# Patient Record
Sex: Male | Born: 1940 | Race: White | Hispanic: No | Marital: Married | Smoking: Former smoker
Health system: Southern US, Community
[De-identification: ages and names within clinical notes are randomized; demographics above are authoritative.]

## PROBLEM LIST (undated history)

## (undated) DIAGNOSIS — J449 Chronic obstructive pulmonary disease, unspecified: Secondary | ICD-10-CM

## (undated) DIAGNOSIS — C801 Malignant (primary) neoplasm, unspecified: Secondary | ICD-10-CM

## (undated) DIAGNOSIS — E7800 Pure hypercholesterolemia, unspecified: Secondary | ICD-10-CM

## (undated) DIAGNOSIS — I219 Acute myocardial infarction, unspecified: Secondary | ICD-10-CM

## (undated) DIAGNOSIS — M1990 Unspecified osteoarthritis, unspecified site: Secondary | ICD-10-CM

## (undated) DIAGNOSIS — I639 Cerebral infarction, unspecified: Secondary | ICD-10-CM

## (undated) DIAGNOSIS — I10 Essential (primary) hypertension: Secondary | ICD-10-CM

## (undated) DIAGNOSIS — I509 Heart failure, unspecified: Secondary | ICD-10-CM

## (undated) HISTORY — PX: BACK SURGERY: SHX140

## (undated) HISTORY — PX: CARDIAC SURGERY: SHX584

## (undated) HISTORY — PX: VASCULAR SURGERY: SHX849

## (undated) HISTORY — PX: CORONARY ARTERY BYPASS GRAFT: SHX141

---

## 2004-12-22 DIAGNOSIS — J439 Emphysema, unspecified: Secondary | ICD-10-CM | POA: Insufficient documentation

## 2013-03-05 DIAGNOSIS — R918 Other nonspecific abnormal finding of lung field: Secondary | ICD-10-CM | POA: Insufficient documentation

## 2013-03-05 DIAGNOSIS — L570 Actinic keratosis: Secondary | ICD-10-CM | POA: Insufficient documentation

## 2013-10-27 DIAGNOSIS — Z85828 Personal history of other malignant neoplasm of skin: Secondary | ICD-10-CM | POA: Insufficient documentation

## 2013-11-26 DIAGNOSIS — I10 Essential (primary) hypertension: Secondary | ICD-10-CM | POA: Insufficient documentation

## 2013-11-26 DIAGNOSIS — D649 Anemia, unspecified: Secondary | ICD-10-CM | POA: Insufficient documentation

## 2014-06-04 DIAGNOSIS — Z9889 Other specified postprocedural states: Secondary | ICD-10-CM | POA: Insufficient documentation

## 2014-07-22 DIAGNOSIS — I7090 Unspecified atherosclerosis: Secondary | ICD-10-CM | POA: Insufficient documentation

## 2014-08-27 DIAGNOSIS — K559 Vascular disorder of intestine, unspecified: Secondary | ICD-10-CM | POA: Insufficient documentation

## 2015-10-19 DIAGNOSIS — K5900 Constipation, unspecified: Secondary | ICD-10-CM | POA: Insufficient documentation

## 2015-10-23 DIAGNOSIS — I723 Aneurysm of iliac artery: Secondary | ICD-10-CM | POA: Insufficient documentation

## 2015-10-29 DIAGNOSIS — R413 Other amnesia: Secondary | ICD-10-CM | POA: Insufficient documentation

## 2016-05-24 DIAGNOSIS — N401 Enlarged prostate with lower urinary tract symptoms: Secondary | ICD-10-CM | POA: Insufficient documentation

## 2017-03-21 DIAGNOSIS — I739 Peripheral vascular disease, unspecified: Secondary | ICD-10-CM | POA: Insufficient documentation

## 2017-06-11 DIAGNOSIS — K4090 Unilateral inguinal hernia, without obstruction or gangrene, not specified as recurrent: Secondary | ICD-10-CM | POA: Insufficient documentation

## 2018-02-17 ENCOUNTER — Emergency Department
Admission: EM | Admit: 2018-02-17 | Discharge: 2018-02-17 | Disposition: A | Discharge status: Home / Self Care | Payer: Medicare Other | Attending: Emergency Medicine | Admitting: Emergency Medicine

## 2018-02-17 ENCOUNTER — Other Ambulatory Visit: Payer: Self-pay

## 2018-02-17 ENCOUNTER — Encounter: Payer: Self-pay | Admitting: Emergency Medicine

## 2018-02-17 DIAGNOSIS — I10 Essential (primary) hypertension: Secondary | ICD-10-CM | POA: Insufficient documentation

## 2018-02-17 DIAGNOSIS — Z951 Presence of aortocoronary bypass graft: Secondary | ICD-10-CM | POA: Diagnosis not present

## 2018-02-17 DIAGNOSIS — R040 Epistaxis: Secondary | ICD-10-CM | POA: Insufficient documentation

## 2018-02-17 DIAGNOSIS — I252 Old myocardial infarction: Secondary | ICD-10-CM | POA: Insufficient documentation

## 2018-02-17 DIAGNOSIS — Z87891 Personal history of nicotine dependence: Secondary | ICD-10-CM | POA: Diagnosis not present

## 2018-02-17 HISTORY — DX: Essential (primary) hypertension: I10

## 2018-02-17 HISTORY — DX: Acute myocardial infarction, unspecified: I21.9

## 2018-02-17 HISTORY — DX: Pure hypercholesterolemia, unspecified: E78.00

## 2018-02-17 LAB — CBC WITH DIFFERENTIAL/PLATELET
BASOS ABS: 0 10*3/uL (ref 0–0.1)
BASOS PCT: 0 %
Band Neutrophils: 2 %
Blasts: 0 %
EOS PCT: 1 %
Eosinophils Absolute: 0.1 10*3/uL (ref 0–0.7)
HCT: 38.4 % — ABNORMAL LOW (ref 40.0–52.0)
Hemoglobin: 12.7 g/dL — ABNORMAL LOW (ref 13.0–18.0)
LYMPHS ABS: 1.4 10*3/uL (ref 1.0–3.6)
Lymphocytes Relative: 14 %
MCH: 31.8 pg (ref 26.0–34.0)
MCHC: 33.1 g/dL (ref 32.0–36.0)
MCV: 95.8 fL (ref 80.0–100.0)
METAMYELOCYTES PCT: 1 %
MONO ABS: 0.6 10*3/uL (ref 0.2–1.0)
MYELOCYTES: 2 %
Monocytes Relative: 6 %
Neutro Abs: 7.7 10*3/uL — ABNORMAL HIGH (ref 1.4–6.5)
Neutrophils Relative %: 74 %
Other: 0 %
PLATELETS: 191 10*3/uL (ref 150–440)
Promyelocytes Relative: 0 %
RBC: 4.01 MIL/uL — ABNORMAL LOW (ref 4.40–5.90)
RDW: 16.2 % — ABNORMAL HIGH (ref 11.5–14.5)
WBC: 9.8 10*3/uL (ref 3.8–10.6)
nRBC: 0 /100 WBC

## 2018-02-17 LAB — PROTIME-INR
INR: 0.94
Prothrombin Time: 12.5 seconds (ref 11.4–15.2)

## 2018-02-17 MED ORDER — TRANEXAMIC ACID 1000 MG/10ML IV SOLN
1000.0000 mg | Freq: Once | INTRAVENOUS | Status: DC
  Filled 2018-02-17: qty 10

## 2018-02-17 NOTE — ED Notes (Signed)
Pt reports bloody nose x 2 hours or more, pt states he is on a blood thinner but does not know the name.  Pt states the nosebleed has subsided some, but is starting to trickle out.  Pt is alert and oriented x 4.  No prior hx of nose bleeds and no injury.  Pt states he has been wearing oxygen at night time which is new for the past few weeks.

## 2018-02-17 NOTE — ED Provider Notes (Signed)
[facility] Emergency Department Provider Note  ____________________________________________   First MD Initiated Contact with Patient 02/17/18 1416     (approximate)  I have reviewed the triage vital signs and the nursing notes.   HISTORY  Chief Complaint Epistaxis    HPI Walter Moody is a 77 y.o. male with medical history as listed below and medication history which apparently includes some sort of blood thinner but he has no idea what kind.  He presents for evaluation of a mild spontaneous right-sided nosebleed.  Apparently started about 3 and half hours prior to arrival.  He was sitting on the commode at the time, having a loose bowel movement (not straining), when his right nostril started to bleed steadily, dripping blood into a trash can.  It has diminished greatly in volume since that time but is still using a small amount of bright red blood from the right nostril.  He has had no trauma, started no new medications, and reports that he has not been picking at the nose or felt that it is been particularly dry recently.   Past Medical History:  Diagnosis Date  . Heart attack ([place])   . Hypercholesteremia   . Hypertension     There are no active problems to display for this patient.   Past Surgical History:  Procedure Laterality Date  . BACK SURGERY    . CORONARY ARTERY BYPASS GRAFT      Prior to Admission medications   Not on File    Allergies Patient has no known allergies.  History reviewed. No pertinent family history.  Social History Social History   Tobacco Use  . Smoking status: Former Research scientist (life sciences)  . Smokeless tobacco: Never Used  Substance Use Topics  . Alcohol use: Never    Frequency: Never  . Drug use: Never    Review of Systems Constitutional: No fever/chills Eyes: No visual changes. ENT: Right-sided nontraumatic epistaxis as described above.  No sore throat. Cardiovascular: Denies chest pain. Respiratory:  Denies shortness of breath. Gastrointestinal: No abdominal pain.  No nausea, no vomiting.  No diarrhea.  No constipation. Genitourinary: Negative for dysuria. Musculoskeletal: Negative for neck pain.  Negative for back pain. Integumentary: Negative for rash. Neurological: Negative for headaches, focal weakness or numbness.   ____________________________________________   PHYSICAL EXAM:  VITAL SIGNS: ED Triage Vitals  Enc Vitals Group     BP 02/17/18 1309 (!) 146/66     Pulse Rate 02/17/18 1309 71     Resp 02/17/18 1309 16     Temp 02/17/18 1309 97.7 F (36.5 C)     Temp Source 02/17/18 1309 Oral     SpO2 02/17/18 1309 98 %     Weight 02/17/18 1304 81.6 kg (180 lb)     Height 02/17/18 1304 1.829 m (6')     Head Circumference --      Peak Flow --      Pain Score 02/17/18 1304 0     Pain Loc --      Pain Edu? --      Excl. in [place]? --     Constitutional: Alert and oriented. Well appearing and in no acute distress. Eyes: Conjunctivae are normal.  Head: Atraumatic. Nose: Small amount of fresh blood oozing from right naris, no involvement of the left naris.  No sign of foreign body, hematoma, nor trauma. Mouth/Throat: Mucous membranes are moist. Neck: No stridor.  No meningeal signs.   Cardiovascular: Normal rate, regular rhythm. Good peripheral  circulation. Grossly normal heart sounds. Respiratory: Normal respiratory effort.  No retractions. Lungs CTAB. Gastrointestinal: Soft and nontender. No distention.  Musculoskeletal: No lower extremity tenderness nor edema. No gross deformities of extremities. Neurologic:  Normal speech and language. No gross focal neurologic deficits are appreciated.  Skin:  Skin is warm, dry and intact. No rash noted. Psychiatric: Mood and affect are normal. Speech and behavior are normal.  ____________________________________________   LABS (all labs ordered are listed, but only abnormal results are displayed)  Labs Reviewed  CBC WITH  DIFFERENTIAL/PLATELET - Abnormal; Notable for the following components:      Result Value   RBC 4.01 (*)    Hemoglobin 12.7 (*)    HCT 38.4 (*)    RDW 16.2 (*)    Neutro Abs 7.7 (*)    All other components within normal limits  PROTIME-INR   ____________________________________________  EKG  None - EKG not ordered by ED physician ____________________________________________  RADIOLOGY   ED MD interpretation: No indication for imaging  Official radiology report(s): No results found.  ____________________________________________   PROCEDURES  Critical Care performed: No   Procedure(s) performed:   .Epistaxis Management Date/Time: 02/17/2018 3:01 PM Performed by: Hinda Kehr, MD Authorized by: Hinda Kehr, MD   Consent:    Consent obtained:  Verbal   Consent given by:  Patient   Risks discussed:  Bleeding, infection and pain   Alternatives discussed:  No treatment Procedure details:    Treatment site:  R anterior   Treatment method:  Anterior pack (TXA-soaked 2x2 gauze)   Treatment complexity:  Limited   Treatment episode: initial   Post-procedure details:    Assessment:  Bleeding stopped   Patient tolerance of procedure:  Tolerated well, no immediate complications     ____________________________________________   INITIAL IMPRESSION / ASSESSMENT AND PLAN / ED COURSE  As part of my medical decision making, I reviewed the following data within the [facility] notes reviewed and incorporated and Labs reviewed     Differential diagnosis includes, but is not limited to, spontaneous nosebleed, traumatic nosebleed, hypocoagulability, etc.  Vital signs are within normal limits.  Lab results are also within normal limits with an appropriate hemoglobin of 12.7, no leukocytosis, and normal INR.  I will place TXA soaked 2 x 2 gauze in the nose for 20 to 30 minutes and see if this will be enough to stop the oozing.  Anticipate discharge and  outpatient follow-up with ENT.  Clinical Course as of Feb 17 1545  Sun Feb 17, 2018  1543 Upon reassessment the patient's nose is not bleeding, not even an ooze.  This time he told me just remembered he was recently started on oxygen at night.  We discussed using bacitracin ointment inside his nostrils or saline nasal spray to help stay moisturized.   gave my usual and customary return precautions.  He understands and agrees with the plan.   [CF]    Clinical Course User Index [CF] Hinda Kehr, MD    ____________________________________________  FINAL CLINICAL IMPRESSION(S) / ED DIAGNOSES  Final diagnoses:  Right-sided epistaxis     MEDICATIONS GIVEN DURING THIS VISIT:  Medications  tranexamic acid (CYKLOKAPRON) injection 1,000 mg (has no administration in time range)     ED Discharge Orders    None       Note:  This document was prepared using Dragon voice recognition software and may include unintentional dictation errors.    Hinda Kehr, MD 02/17/18 [phone]

## 2018-02-17 NOTE — ED Notes (Signed)
ED Provider at bedside. 

## 2018-02-17 NOTE — Discharge Instructions (Addendum)

## 2018-02-17 NOTE — ED Triage Notes (Signed)
Pt started with nose bleed about 2 hrs ago.  Reports he is a blood thinner but has no idea what.  Poor historian, unsure of meds.  Only bleeding from right side. Can see fresh blood but not dripping out of nose at this time.

## 2018-12-10 DIAGNOSIS — Z9181 History of falling: Secondary | ICD-10-CM | POA: Insufficient documentation

## 2019-03-31 DIAGNOSIS — G8929 Other chronic pain: Secondary | ICD-10-CM | POA: Insufficient documentation

## 2019-03-31 DIAGNOSIS — M545 Low back pain, unspecified: Secondary | ICD-10-CM | POA: Insufficient documentation

## 2019-07-21 ENCOUNTER — Other Ambulatory Visit: Payer: Self-pay

## 2019-07-21 DIAGNOSIS — Z20822 Contact with and (suspected) exposure to covid-19: Secondary | ICD-10-CM

## 2019-07-23 LAB — NOVEL CORONAVIRUS, NAA: SARS-CoV-2, NAA: NOT DETECTED

## 2019-07-24 ENCOUNTER — Telehealth: Payer: Self-pay | Admitting: *Deleted

## 2019-07-24 NOTE — Telephone Encounter (Signed)
Patient's daughter is calling for result- she had brought her father for testing because her mother had tested +. Patient is not having symptoms. Advised daughter to continue safe practices, contact PCP for changes and get flu shot.

## 2019-12-23 ENCOUNTER — Other Ambulatory Visit: Payer: Self-pay | Admitting: Nurse Practitioner

## 2019-12-23 NOTE — Progress Notes (Signed)
Entered in error. Disregard.

## 2020-11-23 DIAGNOSIS — H259 Unspecified age-related cataract: Secondary | ICD-10-CM | POA: Insufficient documentation

## 2021-02-14 ENCOUNTER — Ambulatory Visit: Payer: Medicare Other | Admitting: Podiatry

## 2021-02-14 ENCOUNTER — Encounter: Payer: Self-pay | Admitting: Podiatry

## 2021-02-14 ENCOUNTER — Other Ambulatory Visit: Payer: Self-pay

## 2021-02-14 DIAGNOSIS — M79674 Pain in right toe(s): Secondary | ICD-10-CM

## 2021-02-14 DIAGNOSIS — M79675 Pain in left toe(s): Secondary | ICD-10-CM

## 2021-02-14 DIAGNOSIS — B351 Tinea unguium: Secondary | ICD-10-CM

## 2021-02-14 NOTE — Progress Notes (Signed)
  Subjective:  Patient ID: Walter Moody, male    DOB: 10/01/1941,  MRN: [id number]  Chief Complaint  Patient presents with  . Nail Problem    Nail trim RFC  No complaints    80 y.o. male presents with the above complaint. History confirmed with patient.   Objective:  Physical Exam: warm, good capillary refill, no trophic changes or ulcerative lesions, normal DP and PT pulses and normal sensory exam.  Onychomycosis x10 with thickened elongated yellow nail plates L Assessment:   1. Onychomycosis   2. Pain due to onychomycosis of toenails of both feet      Plan:  Patient was evaluated and treated and all questions answered.  Discussed the etiology and treatment options for the condition in detail with the patient. Educated patient on the topical and oral treatment options for mycotic nails. Recommended debridement of the nails today. Sharp and mechanical debridement performed of all painful and mycotic nails today. Nails debrided in length and thickness using a nail nipper to level of comfort. Discussed treatment options including appropriate shoe gear. Follow up as needed for painful nails.    Return in about 3 months (around 05/17/2021) for RFC.

## 2021-05-19 ENCOUNTER — Ambulatory Visit: Payer: Medicare Other | Admitting: Podiatry

## 2022-03-03 ENCOUNTER — Emergency Department
Admission: EM | Admit: 2022-03-03 | Discharge: 2022-03-04 | Disposition: A | Discharge status: Home / Self Care | Payer: Medicare Other | Attending: Emergency Medicine | Admitting: Emergency Medicine

## 2022-03-03 DIAGNOSIS — I779 Disorder of arteries and arterioles, unspecified: Secondary | ICD-10-CM | POA: Insufficient documentation

## 2022-03-03 DIAGNOSIS — R040 Epistaxis: Secondary | ICD-10-CM | POA: Diagnosis not present

## 2022-03-03 DIAGNOSIS — S32302A Unspecified fracture of left ilium, initial encounter for closed fracture: Secondary | ICD-10-CM

## 2022-03-03 DIAGNOSIS — Z7982 Long term (current) use of aspirin: Secondary | ICD-10-CM | POA: Diagnosis not present

## 2022-03-03 DIAGNOSIS — I10 Essential (primary) hypertension: Secondary | ICD-10-CM | POA: Insufficient documentation

## 2022-03-03 DIAGNOSIS — Z7902 Long term (current) use of antithrombotics/antiplatelets: Secondary | ICD-10-CM | POA: Diagnosis not present

## 2022-03-03 DIAGNOSIS — W19XXXA Unspecified fall, initial encounter: Secondary | ICD-10-CM | POA: Diagnosis not present

## 2022-03-03 DIAGNOSIS — J90 Pleural effusion, not elsewhere classified: Secondary | ICD-10-CM | POA: Diagnosis not present

## 2022-03-03 DIAGNOSIS — S32010A Wedge compression fracture of first lumbar vertebra, initial encounter for closed fracture: Secondary | ICD-10-CM

## 2022-03-03 DIAGNOSIS — G319 Degenerative disease of nervous system, unspecified: Secondary | ICD-10-CM | POA: Diagnosis not present

## 2022-03-03 DIAGNOSIS — S0990XA Unspecified injury of head, initial encounter: Secondary | ICD-10-CM | POA: Diagnosis present

## 2022-03-03 DIAGNOSIS — S3992XA Unspecified injury of lower back, initial encounter: Secondary | ICD-10-CM | POA: Diagnosis present

## 2022-03-03 LAB — COMPREHENSIVE METABOLIC PANEL
ALT: 15 U/L (ref 0–44)
AST: 19 U/L (ref 15–41)
Albumin: 3.8 g/dL (ref 3.5–5.0)
Alkaline Phosphatase: 59 U/L (ref 38–126)
Anion gap: 9 (ref 5–15)
BUN: 32 mg/dL — ABNORMAL HIGH (ref 8–23)
CO2: 30 mmol/L (ref 22–32)
Calcium: 8.7 mg/dL — ABNORMAL LOW (ref 8.9–10.3)
Chloride: 101 mmol/L (ref 98–111)
Creatinine, Ser: 2.28 mg/dL — ABNORMAL HIGH (ref 0.61–1.24)
GFR, Estimated: 28 mL/min — ABNORMAL LOW (ref >60)
Glucose, Bld: 130 mg/dL — ABNORMAL HIGH (ref 70–99)
Potassium: 4.2 mmol/L (ref 3.5–5.1)
Sodium: 140 mmol/L (ref 135–145)
Total Bilirubin: 0.9 mg/dL (ref 0.3–1.2)
Total Protein: 7.1 g/dL (ref 6.5–8.1)

## 2022-03-03 LAB — CBC WITH DIFFERENTIAL/PLATELET
Abs Immature Granulocytes: 0.26 10*3/uL — ABNORMAL HIGH (ref 0.00–0.07)
Basophils Absolute: 0.1 10*3/uL (ref 0.0–0.1)
Basophils Relative: 1 %
Eosinophils Absolute: 0.4 10*3/uL (ref 0.0–0.5)
Eosinophils Relative: 5 %
HCT: 28.8 % — ABNORMAL LOW (ref 39.0–52.0)
Hemoglobin: 8.9 g/dL — ABNORMAL LOW (ref 13.0–17.0)
Immature Granulocytes: 3 %
Lymphocytes Relative: 12 %
Lymphs Abs: 1 10*3/uL (ref 0.7–4.0)
MCH: 30.6 pg (ref 26.0–34.0)
MCHC: 30.9 g/dL (ref 30.0–36.0)
MCV: 99 fL (ref 80.0–100.0)
Monocytes Absolute: 0.9 10*3/uL (ref 0.1–1.0)
Monocytes Relative: 12 %
Neutro Abs: 5.4 10*3/uL (ref 1.7–7.7)
Neutrophils Relative %: 67 %
Platelets: 191 10*3/uL (ref 150–400)
RBC: 2.91 MIL/uL — ABNORMAL LOW (ref 4.22–5.81)
RDW: 15.7 % — ABNORMAL HIGH (ref 11.5–15.5)
WBC: 8 10*3/uL (ref 4.0–10.5)
nRBC: 0 % (ref 0.0–0.2)

## 2022-03-03 LAB — PROTIME-INR
INR: 1.2 (ref 0.8–1.2)
Prothrombin Time: 14.8 seconds (ref 11.4–15.2)

## 2022-03-03 NOTE — ED Triage Notes (Signed)
Pt suffered a fall yesterday. He rolled his scooter but denies any head/neck injury or LOC. Pt  has a bruise on the left flank area. Pt also had a nose bleed today and is on a blood thinner. EMS gave a spray of afrin and clamped his nose for 5 mins. Bleeding has stopped at this time.

## 2022-03-04 ENCOUNTER — Emergency Department: Payer: Medicare Other

## 2022-03-04 MED ORDER — OXYMETAZOLINE HCL 0.05 % NA SOLN
2.0000 | Freq: Once | NASAL | Status: AC
  Administered 2022-03-04: 2 via NASAL
  Filled 2022-03-04: qty 30

## 2022-03-04 NOTE — ED Provider Notes (Signed)
[facility] Provider Note    Event Date/Time   First MD Initiated Contact with Patient 03/04/22 0009     (approximate)  History   Chief Complaint: Fall, Flank Pain, and Epistaxis  HPI  Walter Moody is a 81 y.o. male with a past medical history of hypertension, hyperlipidemia, on aspirin/Plavix who presents to the emergency department for nosebleed.  According to the patient he had a fall yesterday has been experiencing some pain in his left flank where he has a bruise.  He states today after blowing his nose he experienced a nosebleed they continued to bleed and they were unable to stop it so they brought him to the emergency department for evaluation.  Upon arrival to the emergency department patient's nosebleed has stopped spontaneously.  Patient does use oxygen as needed at home.  Does not use a humidifier.  Currently the patient appears well, no distress.  States mild pain in the left flank/left lower back from the fall yesterday.  Is not sure if he hit his head yesterday.  But denies any headache.  Physical Exam   Triage Vital Signs: ED Triage Vitals  Enc Vitals Group     BP 03/03/22 2045 (!) 139/94     Pulse Rate 03/03/22 2045 77     Resp 03/03/22 2045 17     Temp 03/03/22 2045 98.3 F (36.8 C)     Temp Source 03/03/22 2045 Oral     SpO2 03/03/22 2045 100 %     Weight --      Height --      Head Circumference --      Peak Flow --      Pain Score 03/03/22 2046 10     Pain Loc --      Pain Edu? --      Excl. in [place]? --     Most recent vital signs: Vitals:   03/03/22 2045 03/03/22 2323  BP: (!) 139/94 (!) 144/78  Pulse: 77 80  Resp: 17 18  Temp: 98.3 F (36.8 C)   SpO2: 100% 97%    General: Awake, no distress.  CV:  Good peripheral perfusion.  Regular rate and rhythm  Resp:  Normal effort.  Equal breath sounds bilaterally.  Abd:  No distention.  Soft, nontender.  No rebound or guarding.  Patient does have mild left CVA tenderness  to palpation where he is mild ecchymosis to the left flank into the left lower back. Other:  Dried blood noted in left nostril, no blood in the right nostril.  No septal hematoma, no obvious septal defect or lesion.   ED Results / Procedures / Treatments   RADIOLOGY  I have personally reviewed the CT renal scan.  Patient has a left-sided pleural effusion.  No obvious intra-abdominal pathology seen on my evaluation. Radiology is read the CT scan as a left iliac crest chip fracture, a left L4 transverse process fracture L1 and L4 age-indeterminate compression fracture.  Stranding in the left flank subcutaneous tissues consistent with ecchymosis seen on my evaluation.  Patient has a small to moderate pleural effusion on the left.  No displaced lower rib fractures.  CT scan of the head is negative for acute abnormality.   MEDICATIONS ORDERED IN ED: Medications  oxymetazoline (AFRIN) 0.05 % nasal spray 2 spray (has no administration in time range)     IMPRESSION / MDM / ASSESSMENT AND PLAN / ED COURSE  I reviewed the triage vital signs and  the nursing notes.  Patient presents to the emergency department for nosebleed that started after blowing his nose tonight.  Patient also states he had a fall yesterday and has been experiencing some pain in the left back/left flank where he has a bruise.  Patient appears to be on aspirin/Plavix per record review.  He is not sure if he hit his head yesterday or not.  Patient's lab work today shows no significant abnormality.  Hemoglobin of 8.9 which is largely unchanged from recent values in his care everywhere chart.  Patient's creatinine is slightly worse currently 2.28 with a baseline closer to 1.8.  I discussed with the patient increasing oral hydration over the next week and following up with his doctor next week for recheck of lab work.  Patient is agreeable.  We will squirt 2 sprays of Afrin into the left nostril as a precaution hopefully to prevent any  further bleeding.  We will send the patient home with the Afrin and a nasal clamp to use if bleeding restarts.  We will obtain CT imaging of the head and a CT renal scan given the fall head injury and left flank pain.  Patient agreeable.  CT renal scan findings are consistent with patient's recent fall showing a left iliac chip fracture transverse process fracture possible wedge compression fracture as well as a small to moderate left pleural effusion which could possibly be related to the fall although the patient denies any rib pain or chest wall pain or shortness of breath.  Patient's vital signs are reassuring.  As the fall happened greater than 24 hours ago I believe the patient would be safe for discharge home.  Did discuss using Afrin if needed at home as well as a nasal clamp if he has any further nosebleeding patient will follow-up with his doctor on Monday for recheck/reevaluation.  Patient agreeable to plan.  FINAL CLINICAL IMPRESSION(S) / ED DIAGNOSES   Fall Epistaxis   Note:  This document was prepared using Dragon voice recognition software and may include unintentional dictation errors.   Harvest Dark, MD 03/04/22 [phone]

## 2022-03-04 NOTE — Discharge Instructions (Signed)
As we discussed please follow-up with your doctor on Monday for recheck/reevaluation.  Please use your prescribed Afrin if you have any further bleeding and clamp your nose for 15 minutes.  If you continue to have bleeding please return to the emergency department for further evaluation.  If you have any significant pain or worsening pain develop any shortness of breath please return to the emergency department.

## 2022-05-22 ENCOUNTER — Encounter: Payer: Self-pay | Admitting: Emergency Medicine

## 2022-05-22 ENCOUNTER — Emergency Department: Payer: Medicare Other

## 2022-05-22 ENCOUNTER — Emergency Department
Admission: EM | Admit: 2022-05-22 | Discharge: 2022-05-22 | Disposition: A | Discharge status: Home / Self Care | Payer: Medicare Other | Attending: Emergency Medicine | Admitting: Emergency Medicine

## 2022-05-22 ENCOUNTER — Other Ambulatory Visit: Payer: Self-pay

## 2022-05-22 DIAGNOSIS — K5900 Constipation, unspecified: Secondary | ICD-10-CM | POA: Insufficient documentation

## 2022-05-22 DIAGNOSIS — I2510 Atherosclerotic heart disease of native coronary artery without angina pectoris: Secondary | ICD-10-CM | POA: Insufficient documentation

## 2022-05-22 DIAGNOSIS — I10 Essential (primary) hypertension: Secondary | ICD-10-CM | POA: Diagnosis not present

## 2022-05-22 DIAGNOSIS — M545 Low back pain, unspecified: Secondary | ICD-10-CM | POA: Insufficient documentation

## 2022-05-22 DIAGNOSIS — R1030 Lower abdominal pain, unspecified: Secondary | ICD-10-CM | POA: Diagnosis present

## 2022-05-22 LAB — CBC
HCT: 27.3 % — ABNORMAL LOW (ref 39.0–52.0)
Hemoglobin: 8.3 g/dL — ABNORMAL LOW (ref 13.0–17.0)
MCH: 29.5 pg (ref 26.0–34.0)
MCHC: 30.4 g/dL (ref 30.0–36.0)
MCV: 97.2 fL (ref 80.0–100.0)
Platelets: 206 10*3/uL (ref 150–400)
RBC: 2.81 MIL/uL — ABNORMAL LOW (ref 4.22–5.81)
RDW: 16.4 % — ABNORMAL HIGH (ref 11.5–15.5)
WBC: 8.2 10*3/uL (ref 4.0–10.5)
nRBC: 0 % (ref 0.0–0.2)

## 2022-05-22 LAB — COMPREHENSIVE METABOLIC PANEL
ALT: 11 U/L (ref 0–44)
AST: 16 U/L (ref 15–41)
Albumin: 4 g/dL (ref 3.5–5.0)
Alkaline Phosphatase: 58 U/L (ref 38–126)
Anion gap: 11 (ref 5–15)
BUN: 26 mg/dL — ABNORMAL HIGH (ref 8–23)
CO2: 27 mmol/L (ref 22–32)
Calcium: 9.4 mg/dL (ref 8.9–10.3)
Chloride: 106 mmol/L (ref 98–111)
Creatinine, Ser: 2.16 mg/dL — ABNORMAL HIGH (ref 0.61–1.24)
GFR, Estimated: 30 mL/min — ABNORMAL LOW (ref >60)
Glucose, Bld: 129 mg/dL — ABNORMAL HIGH (ref 70–99)
Potassium: 4.1 mmol/L (ref 3.5–5.1)
Sodium: 144 mmol/L (ref 135–145)
Total Bilirubin: 0.9 mg/dL (ref 0.3–1.2)
Total Protein: 7.4 g/dL (ref 6.5–8.1)

## 2022-05-22 LAB — URINALYSIS, COMPLETE (UACMP) WITH MICROSCOPIC
Bacteria, UA: NONE SEEN
Bilirubin Urine: NEGATIVE
Glucose, UA: NEGATIVE mg/dL
Ketones, ur: NEGATIVE mg/dL
Nitrite: NEGATIVE
Protein, ur: 30 mg/dL — AB
Specific Gravity, Urine: 1.01 (ref 1.005–1.030)
Squamous Epithelial / HPF: NONE SEEN (ref 0–5)
pH: 6 (ref 5.0–8.0)

## 2022-05-22 MED ORDER — HYDROCODONE-ACETAMINOPHEN 5-325 MG PO TABS: 1.0000 | ORAL_TABLET | ORAL | 0 refills | Status: DC | PRN

## 2022-05-22 MED ORDER — OXYCODONE-ACETAMINOPHEN 5-325 MG PO TABS
1.0000 | ORAL_TABLET | Freq: Once | ORAL | Status: AC
  Administered 2022-05-22: 1 via ORAL
  Filled 2022-05-22: qty 1

## 2022-05-22 NOTE — ED Provider Notes (Signed)
[facility] Provider Note    Event Date/Time   First MD Initiated Contact with Patient 05/22/22 1153     (approximate)  History   Chief Complaint: Back Pain and Constipation  HPI  Walter Moody is a 81 y.o. male with a past medical history of hypertension, hyperlipidemia, CAD, presents to the emergency department for lower abdominal discomfort/back pain and constipation.  According to the patient for the past 2 weeks he has not been able to have a bowel movement.  States he is now having some lower abdominal as well as lower back pain which she relates to the constipation.  Patient has been taking MiraLAX and Colace with no good response.  Patient denies any urinary symptoms.  Physical Exam   Triage Vital Signs: ED Triage Vitals [05/22/22 1148]  Enc Vitals Group     BP 121/76     Pulse Rate 85     Resp 18     Temp 98 F (36.7 C)     Temp Source Oral     SpO2 97 %     Weight 180 lb (81.6 kg)     Height 6' (1.829 m)     Head Circumference      Peak Flow      Pain Score 10     Pain Loc      Pain Edu?      Excl. in [place]?     Most recent vital signs: Vitals:   05/22/22 1148  BP: 121/76  Pulse: 85  Resp: 18  Temp: 98 F (36.7 C)  SpO2: 97%    General: Awake, no distress.  CV:  Good peripheral perfusion.  Regular rate and rhythm  Resp:  Normal effort.  Equal breath sounds bilaterally.  Abd:  No distention.  Soft, nontender.  No rebound or guarding.   ED Results / Procedures / Treatments   EKG  EKG viewed and interpreted by myself shows what appears to be a sinus rhythm rate of 85 bpm with a narrow QRS, normal axis, normal intervals, nonspecific ST changes without ST elevation.  RADIOLOGY  I have personally seen and interpreted the CT images.  I do not see any significant abnormality my evaluation.  No sign of bowel obstruction. Radiology has read the CT scan as largely negative for acute abnormality besides a new L1 endplate  compression fracture.  Otherwise chronic findings including an enlarging left pleural effusion which I have discussed with the patient patient will follow-up with his doctor.     MEDICATIONS ORDERED IN ED: Medications - No data to display   IMPRESSION / MDM / [place] / ED COURSE  I reviewed the triage vital signs and the nursing notes.  Patient's presentation is most consistent with acute presentation with potential threat to life or bodily function.  Patient presents to the emergency department 2 weeks of constipation now with lower abdominal lower back pain.  Overall the patient appears well, no distress.  Does have some mild suprapubic tenderness to palpation.  Mild distention.  We will check a bladder scan to ensure no bladder distention.  We will obtain basic labs and CT scan abdomen/pelvis.  Differential would include bowel obstruction, partial obstruction, fecal impaction, constipation, diverticulitis, colitis, urinary retention.  Patient agreeable to plan of care.  CT shows no bowel obstruction no significant constipation.  Patient does have an enlarging left pleural effusion and a new L1 endplate compression fracture which could be causing his discomfort.  The patient otherwise appears well have discussed the other CT findings including the pleural effusion with the patient he will follow-up with his doctor.  Patient agreeable to plan of care.  FINAL CLINICAL IMPRESSION(S) / ED DIAGNOSES   Constipation Abdominal pain Back pain    Note:  This document was prepared using Dragon voice recognition software and may include unintentional dictation errors.   Harvest Dark, MD 05/22/22 [phone]

## 2022-05-22 NOTE — ED Triage Notes (Signed)
Patient c/o back pain and constipation. Patient reports last BM 2 weeks ago. States he has been taking miralax and colace with small amount of liquid resulting. Patient on 2L [place] at baseline.

## 2022-06-20 ENCOUNTER — Inpatient Hospital Stay
Admission: EM | Admit: 2022-06-20 | Discharge: 2022-06-24 | DRG: 311 | Disposition: A | Discharge status: Home / Self Care | Payer: Medicare Other | Attending: Internal Medicine | Admitting: Internal Medicine

## 2022-06-20 ENCOUNTER — Emergency Department: Payer: Medicare Other

## 2022-06-20 ENCOUNTER — Other Ambulatory Visit: Payer: Self-pay

## 2022-06-20 DIAGNOSIS — Z8673 Personal history of transient ischemic attack (TIA), and cerebral infarction without residual deficits: Secondary | ICD-10-CM

## 2022-06-20 DIAGNOSIS — F03A0 Unspecified dementia, mild, without behavioral disturbance, psychotic disturbance, mood disturbance, and anxiety: Secondary | ICD-10-CM | POA: Diagnosis present

## 2022-06-20 DIAGNOSIS — D489 Neoplasm of uncertain behavior, unspecified: Secondary | ICD-10-CM | POA: Diagnosis present

## 2022-06-20 DIAGNOSIS — R0609 Other forms of dyspnea: Secondary | ICD-10-CM

## 2022-06-20 DIAGNOSIS — D649 Anemia, unspecified: Secondary | ICD-10-CM | POA: Diagnosis present

## 2022-06-20 DIAGNOSIS — Z79899 Other long term (current) drug therapy: Secondary | ICD-10-CM

## 2022-06-20 DIAGNOSIS — R7989 Other specified abnormal findings of blood chemistry: Secondary | ICD-10-CM

## 2022-06-20 DIAGNOSIS — I5033 Acute on chronic diastolic (congestive) heart failure: Secondary | ICD-10-CM | POA: Diagnosis present

## 2022-06-20 DIAGNOSIS — J90 Pleural effusion, not elsewhere classified: Secondary | ICD-10-CM

## 2022-06-20 DIAGNOSIS — Z20822 Contact with and (suspected) exposure to covid-19: Secondary | ICD-10-CM | POA: Diagnosis present

## 2022-06-20 DIAGNOSIS — R0601 Orthopnea: Principal | ICD-10-CM

## 2022-06-20 DIAGNOSIS — J910 Malignant pleural effusion: Secondary | ICD-10-CM | POA: Diagnosis present

## 2022-06-20 DIAGNOSIS — I130 Hypertensive heart and chronic kidney disease with heart failure and stage 1 through stage 4 chronic kidney disease, or unspecified chronic kidney disease: Secondary | ICD-10-CM | POA: Diagnosis present

## 2022-06-20 DIAGNOSIS — I2510 Atherosclerotic heart disease of native coronary artery without angina pectoris: Secondary | ICD-10-CM | POA: Diagnosis present

## 2022-06-20 DIAGNOSIS — I440 Atrioventricular block, first degree: Secondary | ICD-10-CM | POA: Diagnosis present

## 2022-06-20 DIAGNOSIS — F0390 Unspecified dementia without behavioral disturbance: Secondary | ICD-10-CM

## 2022-06-20 DIAGNOSIS — I248 Other forms of acute ischemic heart disease: Principal | ICD-10-CM | POA: Diagnosis present

## 2022-06-20 DIAGNOSIS — Z87891 Personal history of nicotine dependence: Secondary | ICD-10-CM

## 2022-06-20 DIAGNOSIS — R778 Other specified abnormalities of plasma proteins: Secondary | ICD-10-CM

## 2022-06-20 DIAGNOSIS — J9601 Acute respiratory failure with hypoxia: Secondary | ICD-10-CM | POA: Diagnosis not present

## 2022-06-20 DIAGNOSIS — Z951 Presence of aortocoronary bypass graft: Secondary | ICD-10-CM

## 2022-06-20 DIAGNOSIS — Z9889 Other specified postprocedural states: Secondary | ICD-10-CM

## 2022-06-20 DIAGNOSIS — Z9981 Dependence on supplemental oxygen: Secondary | ICD-10-CM

## 2022-06-20 DIAGNOSIS — R269 Unspecified abnormalities of gait and mobility: Secondary | ICD-10-CM | POA: Diagnosis present

## 2022-06-20 DIAGNOSIS — J9691 Respiratory failure, unspecified with hypoxia: Secondary | ICD-10-CM | POA: Diagnosis present

## 2022-06-20 DIAGNOSIS — N400 Enlarged prostate without lower urinary tract symptoms: Secondary | ICD-10-CM | POA: Diagnosis present

## 2022-06-20 DIAGNOSIS — E7800 Pure hypercholesterolemia, unspecified: Secondary | ICD-10-CM | POA: Diagnosis present

## 2022-06-20 DIAGNOSIS — R918 Other nonspecific abnormal finding of lung field: Secondary | ICD-10-CM | POA: Diagnosis present

## 2022-06-20 DIAGNOSIS — J439 Emphysema, unspecified: Secondary | ICD-10-CM | POA: Diagnosis present

## 2022-06-20 DIAGNOSIS — J9611 Chronic respiratory failure with hypoxia: Secondary | ICD-10-CM

## 2022-06-20 DIAGNOSIS — Z8679 Personal history of other diseases of the circulatory system: Secondary | ICD-10-CM

## 2022-06-20 DIAGNOSIS — N1832 Chronic kidney disease, stage 3b: Secondary | ICD-10-CM | POA: Diagnosis present

## 2022-06-20 DIAGNOSIS — J9383 Other pneumothorax: Secondary | ICD-10-CM | POA: Diagnosis not present

## 2022-06-20 DIAGNOSIS — I723 Aneurysm of iliac artery: Secondary | ICD-10-CM | POA: Diagnosis present

## 2022-06-20 DIAGNOSIS — G8929 Other chronic pain: Secondary | ICD-10-CM | POA: Diagnosis present

## 2022-06-20 DIAGNOSIS — I252 Old myocardial infarction: Secondary | ICD-10-CM

## 2022-06-20 DIAGNOSIS — J918 Pleural effusion in other conditions classified elsewhere: Secondary | ICD-10-CM | POA: Diagnosis present

## 2022-06-20 DIAGNOSIS — I739 Peripheral vascular disease, unspecified: Secondary | ICD-10-CM | POA: Diagnosis present

## 2022-06-20 DIAGNOSIS — Z66 Do not resuscitate: Secondary | ICD-10-CM | POA: Diagnosis present

## 2022-06-20 DIAGNOSIS — Z7982 Long term (current) use of aspirin: Secondary | ICD-10-CM

## 2022-06-20 HISTORY — DX: Chronic obstructive pulmonary disease, unspecified: J44.9

## 2022-06-20 HISTORY — DX: Cerebral infarction, unspecified: I63.9

## 2022-06-20 HISTORY — DX: Unspecified osteoarthritis, unspecified site: M19.90

## 2022-06-20 HISTORY — DX: Malignant (primary) neoplasm, unspecified: C80.1

## 2022-06-20 HISTORY — DX: Heart failure, unspecified: I50.9

## 2022-06-20 LAB — COMPREHENSIVE METABOLIC PANEL
ALT: 14 U/L (ref 0–44)
AST: 17 U/L (ref 15–41)
Albumin: 4.3 g/dL (ref 3.5–5.0)
Alkaline Phosphatase: 67 U/L (ref 38–126)
Anion gap: 8 (ref 5–15)
BUN: 36 mg/dL — ABNORMAL HIGH (ref 8–23)
CO2: 30 mmol/L (ref 22–32)
Calcium: 9.2 mg/dL (ref 8.9–10.3)
Chloride: 103 mmol/L (ref 98–111)
Creatinine, Ser: 1.99 mg/dL — ABNORMAL HIGH (ref 0.61–1.24)
GFR, Estimated: 33 mL/min — ABNORMAL LOW (ref >60)
Glucose, Bld: 135 mg/dL — ABNORMAL HIGH (ref 70–99)
Potassium: 3.6 mmol/L (ref 3.5–5.1)
Sodium: 141 mmol/L (ref 135–145)
Total Bilirubin: 0.7 mg/dL (ref 0.3–1.2)
Total Protein: 7.4 g/dL (ref 6.5–8.1)

## 2022-06-20 LAB — CBC WITH DIFFERENTIAL/PLATELET
Abs Immature Granulocytes: 0.13 10*3/uL — ABNORMAL HIGH (ref 0.00–0.07)
Basophils Absolute: 0.1 10*3/uL (ref 0.0–0.1)
Basophils Relative: 1 %
Eosinophils Absolute: 0.1 10*3/uL (ref 0.0–0.5)
Eosinophils Relative: 3 %
HCT: 28.2 % — ABNORMAL LOW (ref 39.0–52.0)
Hemoglobin: 8.3 g/dL — ABNORMAL LOW (ref 13.0–17.0)
Immature Granulocytes: 2 %
Lymphocytes Relative: 11 %
Lymphs Abs: 0.6 10*3/uL — ABNORMAL LOW (ref 0.7–4.0)
MCH: 27.9 pg (ref 26.0–34.0)
MCHC: 29.4 g/dL — ABNORMAL LOW (ref 30.0–36.0)
MCV: 94.9 fL (ref 80.0–100.0)
Monocytes Absolute: 0.5 10*3/uL (ref 0.1–1.0)
Monocytes Relative: 10 %
Neutro Abs: 4.1 10*3/uL (ref 1.7–7.7)
Neutrophils Relative %: 73 %
Platelets: 200 10*3/uL (ref 150–400)
RBC: 2.97 MIL/uL — ABNORMAL LOW (ref 4.22–5.81)
RDW: 17 % — ABNORMAL HIGH (ref 11.5–15.5)
WBC: 5.6 10*3/uL (ref 4.0–10.5)
nRBC: 0 % (ref 0.0–0.2)

## 2022-06-20 LAB — TROPONIN I (HIGH SENSITIVITY)
Troponin I (High Sensitivity): 33 ng/L — ABNORMAL HIGH (ref <18)
Troponin I (High Sensitivity): 35 ng/L — ABNORMAL HIGH (ref <18)

## 2022-06-20 LAB — RESP PANEL BY RT-PCR (FLU A&B, COVID) ARPGX2
Influenza A by PCR: NEGATIVE
Influenza B by PCR: NEGATIVE
SARS Coronavirus 2 by RT PCR: NEGATIVE

## 2022-06-20 LAB — BRAIN NATRIURETIC PEPTIDE: B Natriuretic Peptide: 409.7 pg/mL — ABNORMAL HIGH (ref 0.0–100.0)

## 2022-06-20 LAB — PROCALCITONIN: Procalcitonin: <0.1 ng/mL

## 2022-06-20 LAB — MAGNESIUM: Magnesium: 2.5 mg/dL — ABNORMAL HIGH (ref 1.7–2.4)

## 2022-06-20 LAB — LACTIC ACID, PLASMA: Lactic Acid, Venous: 0.8 mmol/L (ref 0.5–1.9)

## 2022-06-20 MED ORDER — ONDANSETRON HCL 4 MG PO TABS: 4.0000 mg | ORAL_TABLET | Freq: Four times a day (QID) | ORAL | Status: DC | PRN

## 2022-06-20 MED ORDER — IPRATROPIUM-ALBUTEROL 0.5-2.5 (3) MG/3ML IN SOLN
3.0000 mL | RESPIRATORY_TRACT | Status: DC | PRN
Start: 2022-06-20 — End: 2022-06-24

## 2022-06-20 MED ORDER — MORPHINE SULFATE (PF) 2 MG/ML IV SOLN: 2.0000 mg | INTRAVENOUS | Status: DC | PRN

## 2022-06-20 MED ORDER — MOMETASONE FURO-FORMOTEROL FUM 200-5 MCG/ACT IN AERO
2.0000 | INHALATION_SPRAY | Freq: Two times a day (BID) | RESPIRATORY_TRACT | Status: DC
  Administered 2022-06-21 – 2022-06-24 (×7): 2 via RESPIRATORY_TRACT
  Filled 2022-06-20: qty 8.8

## 2022-06-20 MED ORDER — TRAZODONE HCL 50 MG PO TABS
25.0000 mg | ORAL_TABLET | Freq: Every day | ORAL | Status: DC
  Administered 2022-06-20 – 2022-06-23 (×4): 25 mg via ORAL
  Filled 2022-06-20 (×4): qty 1

## 2022-06-20 MED ORDER — ACETAMINOPHEN 325 MG PO TABS: 650.0000 mg | ORAL_TABLET | Freq: Four times a day (QID) | ORAL | Status: DC | PRN

## 2022-06-20 MED ORDER — ALBUTEROL SULFATE (2.5 MG/3ML) 0.083% IN NEBU: 2.5000 mg | INHALATION_SOLUTION | RESPIRATORY_TRACT | Status: DC | PRN

## 2022-06-20 MED ORDER — ALBUTEROL SULFATE (2.5 MG/3ML) 0.083% IN NEBU: 2.5000 mg | INHALATION_SOLUTION | Freq: Four times a day (QID) | RESPIRATORY_TRACT | Status: DC

## 2022-06-20 MED ORDER — ONDANSETRON HCL 4 MG/2ML IJ SOLN: 4.0000 mg | Freq: Four times a day (QID) | INTRAMUSCULAR | Status: DC | PRN

## 2022-06-20 MED ORDER — FLUOXETINE HCL 20 MG PO CAPS
20.0000 mg | ORAL_CAPSULE | Freq: Every day | ORAL | Status: DC
  Administered 2022-06-21 – 2022-06-24 (×4): 20 mg via ORAL
  Filled 2022-06-20 (×4): qty 1

## 2022-06-20 MED ORDER — ACETAMINOPHEN 650 MG RE SUPP: 650.0000 mg | Freq: Four times a day (QID) | RECTAL | Status: DC | PRN

## 2022-06-20 MED ORDER — FUROSEMIDE 10 MG/ML IJ SOLN: 40.0000 mg | Freq: Two times a day (BID) | INTRAMUSCULAR | Status: DC

## 2022-06-20 MED ORDER — ASPIRIN 81 MG PO TBEC
81.0000 mg | DELAYED_RELEASE_TABLET | Freq: Every day | ORAL | Status: DC
  Administered 2022-06-20 – 2022-06-24 (×5): 81 mg via ORAL
  Filled 2022-06-20 (×5): qty 1

## 2022-06-20 MED ORDER — HYDROCODONE-ACETAMINOPHEN 5-325 MG PO TABS
1.0000 | ORAL_TABLET | ORAL | Status: DC | PRN
  Administered 2022-06-21 (×2): 1 via ORAL
  Filled 2022-06-20 (×4): qty 1

## 2022-06-20 MED ORDER — ACETAMINOPHEN 500 MG PO TABS: 500.0000 mg | ORAL_TABLET | Freq: Four times a day (QID) | ORAL | Status: DC | PRN

## 2022-06-20 MED ORDER — HYDROCODONE-ACETAMINOPHEN 5-325 MG PO TABS
1.0000 | ORAL_TABLET | Freq: Once | ORAL | Status: AC
  Administered 2022-06-20: 1 via ORAL
  Filled 2022-06-20: qty 1

## 2022-06-20 MED ORDER — ATORVASTATIN CALCIUM 20 MG PO TABS
40.0000 mg | ORAL_TABLET | Freq: Every day | ORAL | Status: DC
  Administered 2022-06-20 – 2022-06-23 (×4): 40 mg via ORAL
  Filled 2022-06-20 (×4): qty 2

## 2022-06-20 MED ORDER — ENOXAPARIN SODIUM 40 MG/0.4ML IJ SOSY
40.0000 mg | PREFILLED_SYRINGE | INTRAMUSCULAR | Status: DC
  Administered 2022-06-21 – 2022-06-23 (×3): 40 mg via SUBCUTANEOUS
  Filled 2022-06-20 (×3): qty 0.4

## 2022-06-20 MED ORDER — TAMSULOSIN HCL 0.4 MG PO CAPS
0.8000 mg | ORAL_CAPSULE | Freq: Every day | ORAL | Status: DC
  Administered 2022-06-21 – 2022-06-24 (×4): 0.8 mg via ORAL
  Filled 2022-06-20 (×4): qty 2

## 2022-06-20 MED ORDER — SENNOSIDES-DOCUSATE SODIUM 8.6-50 MG PO TABS
1.0000 | ORAL_TABLET | Freq: Every day | ORAL | Status: DC
Start: 2022-06-20 — End: 2022-06-24
  Administered 2022-06-20 – 2022-06-24 (×5): 1 via ORAL
  Filled 2022-06-20 (×5): qty 1

## 2022-06-20 MED ORDER — FUROSEMIDE 40 MG PO TABS
40.0000 mg | ORAL_TABLET | Freq: Two times a day (BID) | ORAL | Status: DC
  Administered 2022-06-20 – 2022-06-24 (×8): 40 mg via ORAL
  Filled 2022-06-20 (×8): qty 1

## 2022-06-20 NOTE — ED Triage Notes (Signed)
Patient presents with SOB x 2 weeks (history of emphysema and CHF); Denies fevers but does report productive cough; Chronically on 2L O2

## 2022-06-20 NOTE — Assessment & Plan Note (Addendum)
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

## 2022-06-20 NOTE — Assessment & Plan Note (Signed)
Continue aspirin and statin. 

## 2022-06-20 NOTE — ED Provider Notes (Signed)
[facility] Provider Note    Event Date/Time   First MD Initiated Contact with Patient 06/20/22 1613     (approximate)   History   Shortness of Breath (Patient presents with SOB x 2 weeks (history of emphysema and CHF); Denies fevers but does report productive cough; Chronically on 2L O2)  EM caveat: Patient with some mild dementia per his daughter, and is not a good historian  HPI  Walter Moody is a 81 y.o. male who on review of primary care note from July 12 of this year he is noted to have slow decline in health status, has edema and dyspnea and is on furosemide.  Also utilizes albuterol.  During that visit he was diagnosed with volume overload has a history of diastolic dysfunction taking furosemide.  Prior occipital stroke.  Memory deficits coronary disease COPD repaired AAA   Patient reports he feels short of breath.  Daughter reports he had about 2 weeks of a fairly progressive increasing shortness of breath to the point that both she and mother felt like he needed to come to the hospital, tried again, last night but daughter came and brought him to the ER today.  He has been coughing just a little bit per feels real short of breath especially when he lays down.  Uses oxygen at home.  They have not noticed any fevers or wheezing.  Has not had a productive cough.  He takes his furosemide routinely and continues to urinate well  Physical Exam   Triage Vital Signs: ED Triage Vitals  Enc Vitals Group     BP 06/20/22 1120 (!) 142/82     Pulse Rate 06/20/22 1120 78     Resp 06/20/22 1120 (!) 22     Temp 06/20/22 1120 98 F (36.7 C)     Temp Source 06/20/22 1120 Oral     SpO2 06/20/22 1120 100 %     Weight 06/20/22 1121 179 lb 14.3 oz (81.6 kg)     Height 06/20/22 1121 6' (1.829 m)     Head Circumference --      Peak Flow --      Pain Score 06/20/22 1120 10     Pain Loc --      Pain Edu? --      Excl. in [place]? --     Most recent vital  signs: Vitals:   06/20/22 2030 06/20/22 2053  BP: (!) 133/91 138/72  Pulse: 99 95  Resp: 17 20  Temp:    SpO2: 98% 96%     General: Awake, no distress.  Work of breathing relatively normal, on 2 L nasal cannula oxygen saturation 98%. CV:  Good peripheral perfusion.  Normal heart tones Resp:  Normal effort.  Left lung notably diminished lung sounds in the lower lung fields.  Right lung clear.  No wheezing.  No cough noted.  No tripoding or respiratory distress noted at this time Abd:  No distention.  Nontender nondistended Other:  Well-perfused lower extremities.  Warm to touch.  Onychomycosis noted around the toenails, but no surrounding erythema redness or warmth.   ED Results / Procedures / Treatments   Labs (all labs ordered are listed, but only abnormal results are displayed) Labs Reviewed  CBC WITH DIFFERENTIAL/PLATELET - Abnormal; Notable for the following components:      Result Value   RBC 2.97 (*)    Hemoglobin 8.3 (*)    HCT 28.2 (*)    MCHC 29.4 (*)  RDW 17.0 (*)    Lymphs Abs 0.6 (*)    Abs Immature Granulocytes 0.13 (*)    All other components within normal limits  COMPREHENSIVE METABOLIC PANEL - Abnormal; Notable for the following components:   Glucose, Bld 135 (*)    BUN 36 (*)    Creatinine, Ser 1.99 (*)    GFR, Estimated 33 (*)    All other components within normal limits  MAGNESIUM - Abnormal; Notable for the following components:   Magnesium 2.5 (*)    All other components within normal limits  BRAIN NATRIURETIC PEPTIDE - Abnormal; Notable for the following components:   B Natriuretic Peptide 409.7 (*)    All other components within normal limits  TROPONIN I (HIGH SENSITIVITY) - Abnormal; Notable for the following components:   Troponin I (High Sensitivity) 33 (*)    All other components within normal limits  TROPONIN I (HIGH SENSITIVITY) - Abnormal; Notable for the following components:   Troponin I (High Sensitivity) 35 (*)    All other  components within normal limits  RESP PANEL BY RT-PCR (FLU A&B, COVID) ARPGX2  CULTURE, BLOOD (ROUTINE X 2)  CULTURE, BLOOD (ROUTINE X 2)  PROCALCITONIN  LACTIC ACID, PLASMA     EKG  And interpreted by me at 4 PM Heart rate 80 QRS 90 QTc 470 Normal sinus rhythm, mild very nonspecific T wave abnormality seen in lateral precordial leads and Q waves noted in septal region   RADIOLOGY Chest x-ray interpreted by me as left lung pleural effusion and likely underlying consolidation.  Also suspicious for mild bilateral infiltrates/edema.   CT Chest Wo Contrast  Result Date: 06/20/2022 CLINICAL DATA:  Cough, shortness of breath x2 weeks EXAM: CT CHEST WITHOUT CONTRAST TECHNIQUE: Multidetector CT imaging of the chest was performed following the standard protocol without IV contrast. RADIATION DOSE REDUCTION: This exam was performed according to the departmental dose-optimization program which includes automated exposure control, adjustment of the mA and/or kV according to patient size and/or use of iterative reconstruction technique. COMPARISON:  None Available. FINDINGS: Cardiovascular: Extensive coronary artery calcifications are seen. There is previous coronary artery bypass. Heart is enlarged in size. Mediastinum/Nodes: No significant lymphadenopathy is seen. Lungs/Pleura: Centrilobular and panlobular emphysema is seen. In image 65 of series 5, there is 1.5 x 1.3 cm nodule with spiculated margins in the anterolateral aspect of right upper lobe. There is moderate to large left pleural effusion part of which appears to be loculated in the lateral aspect of left lower lung field. There is no significant right pleural effusion. Linear infiltrates are seen in lingula. In image 112 of series 3, there is 2.8 x 1.7 cm pleural-based nodular infiltrate in left lower lobe. In image 109, there is 8 mm pleural-based nodule in right lower lobe. Fibrotic changes are noted in right middle lobe, anterior left upper  lobe and anterior right upper lobe. There is no pneumothorax. Upper Abdomen: There is previous endovascular stent repair in abdominal aorta. A stent is noted in proximal courses of celiac and SMA. There is 1.4 cm calculus in the upper pole of left kidney. Musculoskeletal: Decrease in height of lower endplate of body of L2 vertebra has not changed. There is mild decrease in height of upper endplate of body of L1 vertebra with no significant change. IMPRESSION: There is 2.8 cm pleural-based nodular density in left lower lobe. This may suggest atelectasis/pneumonia and possibly neoplastic process. There is a 1.5 cm nodule with spiculated margins in right upper lobe. There  is 8 mm pleural-based nodule in right lower lobe. Possibility of malignant neoplastic process is not excluded. Follow-up PET-CT and tissue sampling as warranted should be considered. Moderate sized left pleural effusion some of which is loculated along the lateral aspect of left lower lung field. This may suggest parapneumonic effusion or pleural metastatic disease. Severe COPD.  No significant lymphadenopathy is seen in mediastinum. Extensive coronary artery disease with previous coronary bypass surgery. Severe atherosclerotic changes are noted in abdominal aorta and its major branches. There is a 1.4 cm left renal calculus. Other findings as described in the body of the report. Electronically Signed   By: Elmer Picker M.D.   On: 06/20/2022 17:36   DG Chest 2 View  Result Date: 06/20/2022 CLINICAL DATA:  Shortness of breath EXAM: CHEST - 2 VIEW COMPARISON:  None Available. FINDINGS: Cardiomegaly status post median sternotomy and CABG. Moderate left pleural effusion associated atelectasis or consolidation. Diffuse bilateral interstitial opacity. Underlying emphysema. Disc degenerative disease of the thoracic spine. IMPRESSION: 1. Moderate left pleural effusion and associated atelectasis or consolidation. 2. Diffuse bilateral interstitial  opacity, consistent with edema and/or infection. No focal airspace opacity. 3. Underlying emphysema. Electronically Signed   By: Delanna Ahmadi M.D.   On: 06/20/2022 12:15      PROCEDURES:  Critical Care performed: No  Procedures   MEDICATIONS ORDERED IN ED: Medications  albuterol (PROVENTIL) (2.5 MG/3ML) 0.083% nebulizer solution 2.5 mg (has no administration in time range)  aspirin EC tablet 81 mg (81 mg Oral Given 06/20/22 2234)  atorvastatin (LIPITOR) tablet 40 mg (40 mg Oral Given 06/20/22 2234)  furosemide (LASIX) tablet 40 mg (40 mg Oral Given 06/20/22 2234)  FLUoxetine (PROZAC) capsule 20 mg (has no administration in time range)  traZODone (DESYREL) tablet 25 mg (25 mg Oral Given 06/20/22 2234)  tamsulosin (FLOMAX) capsule 0.8 mg (has no administration in time range)  senna-docusate (Senokot-S) tablet 1 tablet (1 tablet Oral Given 06/20/22 2234)  mometasone-formoterol (DULERA) 200-5 MCG/ACT inhaler 2 puff (has no administration in time range)  acetaminophen (TYLENOL) tablet 650 mg (has no administration in time range)    Or  acetaminophen (TYLENOL) suppository 650 mg (has no administration in time range)  ondansetron (ZOFRAN) tablet 4 mg (has no administration in time range)    Or  ondansetron (ZOFRAN) injection 4 mg (has no administration in time range)  morphine (PF) 2 MG/ML injection 2 mg (has no administration in time range)  HYDROcodone-acetaminophen (NORCO/VICODIN) 5-325 MG per tablet 1 tablet (has no administration in time range)  enoxaparin (LOVENOX) injection 40 mg (has no administration in time range)  ipratropium-albuterol (DUONEB) 0.5-2.5 (3) MG/3ML nebulizer solution 3 mL (has no administration in time range)  HYDROcodone-acetaminophen (NORCO/VICODIN) 5-325 MG per tablet 1 tablet (1 tablet Oral Given 06/20/22 2234)     IMPRESSION / MDM / ASSESSMENT AND PLAN / ED COURSE  I reviewed the triage vital signs and the nursing notes.                              Differential  diagnosis includes, but is not limited to, CHF, empyema, pneumonia, volume overload, COPD, asthma or reactive airway disease etc.  No chest pain.  No unilateral leg swelling no clinical signs or symptoms of be highly suggestive of acute thromboembolic etiology.  He relates dyspnea progressive in nature orthopnea, nighttime dyspnea.  He does appear volume overloaded on his x-ray and exhibits some significantly diminished lung sounds  in the left side but no wheezing and does not clearly show evidence of acute reactive airway disease or COPD exacerbation    Labs are remarkable for elevated BNP at 400.  Elevated troponin minimally elevated on 2 checks in the mid 30s.  Anemia with hemoglobin of 8.3 that appears to be chronic.  Patient's presentation is most consistent with acute complicated illness / injury requiring diagnostic workup.  The patient is on the cardiac monitor to evaluate for evidence of arrhythmia and/or significant heart rate changes.  Patient case discussed with Dr. Damita Dunnings.  Given patient's imaging findings, especially chest CT and the etiology of effusion and possible consolidation in the left lower lobe is not yet clear discussed case.  Additional labs including blood cultures and procalcitonin are pending, though by my assessment there is no clear evidence of infection at this time.  Discussed with patient and his daughter, and they are agreeable with plan for admission for further work-up.  I also discussed case with the hospitalist, and believe the patient would benefit from pulmonary consult and possibly consideration for thoracentesis and or at least further diagnostic work-up as to cause and consideration for potential loculated effusion.  Some findings concerning for a potential for metastatic disease also noted though not confirmed        FINAL CLINICAL IMPRESSION(S) / ED DIAGNOSES   Final diagnoses:  Orthopnea  Pleural effusion, left  Pulmonary nodules     Rx / DC  Orders   ED Discharge Orders     None        Note:  This document was prepared using Dragon voice recognition software and may include unintentional dictation errors.   Delman Kitten, MD 06/21/22 [phone]

## 2022-06-20 NOTE — Assessment & Plan Note (Signed)
Delirium precautions --Get up during the day --Keep blinds open and lights on during daylight hours --Minimize the use of opioids/benzodiazepines --Reorient the patient frequently, provide easily visible clock and calendar --Provide sensory aids like glasses, hearing aids --Encourage ambulation, regular activities and visitors to maintain cognitive stimulation  --Patient would benefit from having family members at bedside to reinforce his orientation.

## 2022-06-20 NOTE — Assessment & Plan Note (Addendum)
History of beta blocker intolerance -bradycardia to 40s Patient presents with shortness of breath, dyspnea on exertion lower extremity edema, pleural effusion, elevated troponin of 35 which could be demand ischemia as well as BNP elevated at 409 Trial of IV Lasix Patient was taken off beta-blockers a couple months ago by his PCP due to symptomatic bradycardia with heart rate in the 40s. Daily weights with intake and output monitoring Echocardiogram

## 2022-06-20 NOTE — Assessment & Plan Note (Addendum)
Elevated troponin Troponin of 35 but without complaints of chest pain and with nonacute EKG Elevated troponin likely related to demand ischemia from ongoing dyspnea Continue aspirin, atorvastatin

## 2022-06-20 NOTE — Assessment & Plan Note (Addendum)
Chronic respiratory failure with hypoxia Not acutely exacerbated Continue home inhaler DuoNebs as needed Patient's O2 requirement is at baseline Continue supplemental oxygen

## 2022-06-20 NOTE — Assessment & Plan Note (Addendum)
IR consulted for thoracentesis to help determine etiology of pleural effusion CT chest showing moderate-sized left pleural effusion some of which is loculated suggesting parapneumonic effusion or pleural metastatic disease N.p.o. after midnight.  SCD for DVT prophylaxis

## 2022-06-20 NOTE — Assessment & Plan Note (Signed)
Renal function at baseline 

## 2022-06-20 NOTE — ED Notes (Signed)
ED Provider at bedside. 

## 2022-06-20 NOTE — Assessment & Plan Note (Addendum)
Multifactorial related to pleural effusion, CHF with exacerbation, possible malignancy based on CT findings with chronic physical deconditioning.  Lower suspicion for infectious process and lower suspicion for PE O2 requirement is at baseline so continue home O2 Treat primary suspected etiologies of CHF and pleural effusion of undetermined etiology as outlined below

## 2022-06-20 NOTE — Assessment & Plan Note (Signed)
Patient has history of multiple stable pulmonary nodules per review of PCPs note. Current CT was not compared to prior stating none was available Current CT showing multiple nodules somewhat spiculated margins Suggest reread with comparison to prior, otherwise recommendation for follow-up PET CT and tissue sampling is recommended by radiologist Follow-up cytology on pleural tap

## 2022-06-20 NOTE — ED Notes (Signed)
Patient is alert and oriented, answering RN questions. Daughter states patient has dementia and has "his moments"

## 2022-06-20 NOTE — IPAL (Addendum)
  Interdisciplinary Goals of Care Family Meeting   Date carried out: 06/20/2022  Location of the meeting: Phone conference  Member's involved: Physician and Family Member or next of kin  Durable Power of Attorney or acting medical decision maker: daughter, Helmut Muster , wife Adain Geurin   Discussion: We discussed goals of care for [employer]    Code status: Full DNR  Disposition: Home  Time spent for the meeting: Portal, MD  06/20/2022, 10:17 PM

## 2022-06-20 NOTE — Assessment & Plan Note (Addendum)
Physical deconditioning and ambulatory dysfunction At baseline ambulates with a walker Hydrocodone for pain PT/OT TOC consults

## 2022-06-20 NOTE — H&P (Signed)
History and Physical    Patient: Walter Moody SEG:[id number] DOB: 08/23/1941 DOA: 06/20/2022 DOS: the patient was seen and examined on 06/20/2022 PCP: Jacklynn Barnacle, MD  Patient coming from: Home  Chief Complaint:  Chief Complaint  Patient presents with   Shortness of Breath    Patient presents with SOB x 2 weeks (history of emphysema and CHF); Denies fevers but does report productive cough; Chronically on 2L O2   HPI: Walter Moody is a 81 y.o. 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  He has been coughing but denies fever or chills.  Denies chest pain. ED course and data review: Vitals within normal limits except for mild tachypnea of 22-25.  Labs with troponin 35 and BNP 4 7, WBC 5.6 with lactic acid 0.8 and procalcitonin pending.  Hemoglobin 8.3 which is his baseline and creatinine 1.99 which is at baseline EKG, personally viewed and interpreted showing sinus at 79 with first-degree AV block and no acute ST-T wave changes chest x-ray showed moderate left pleural effusion and associated atelectasis or consolidation.  Follow-up CT chest without contrast showed the following:  IMPRESSION: There is 2.8 cm pleural-based nodular density in left lower lobe. This may suggest atelectasis/pneumonia and possibly neoplastic process. There is a 1.5 cm nodule with  spiculated margins in right upper lobe. There is 8 mm pleural-based nodule in right lower lobe. Possibility of malignant neoplastic process is not excluded. Follow-up PET-CT and tissue sampling as warranted should be considered. Moderate sized left pleural effusion some of which is loculated along the lateral aspect of left lower lung field. This may suggest parapneumonic effusion or pleural metastatic disease. Severe COPD.  No significant lymphadenopathy is seen in mediastinum. Extensive coronary artery disease with previous coronary bypass surgery. Severe atherosclerotic changes are noted in abdominal aorta and its major branches. There is a 1.4 cm left renal calculus. Other findings as described in the body of the report.   Hospitalist consulted for admission for diagnostic and therapeutic thoracentesis in the a.m.   Past Medical History:  Diagnosis Date   Arthritis    Cancer ([place])    CHF (congestive heart failure) (HCC)    COPD (chronic obstructive pulmonary disease) ([place])    Heart attack ([place])    Hypercholesteremia    Hypertension    Stroke [facility])    Past Surgical History:  Procedure Laterality Date   BACK SURGERY     CARDIAC SURGERY     CORONARY ARTERY BYPASS GRAFT     VASCULAR SURGERY     Social History:  reports that he has quit smoking. His smoking use included cigarettes. He has never used smokeless tobacco. He reports that he does not currently use alcohol. He reports that he does not use drugs.  No Known Allergies  History reviewed. No pertinent family history.  Prior to Admission medications   Medication Sig Start Date End Date  Taking? Authorizing Provider  acetaminophen (TYLENOL) 500 MG tablet Take 500-1,000 mg by mouth every 6 (six) hours as needed for mild pain or moderate pain.   Yes [provider]  albuterol (PROVENTIL) (2.5 MG/3ML) 0.083% nebulizer solution Take 2.5 mg by nebulization 4 (four) times daily.   Yes [provider]   albuterol (VENTOLIN HFA) 108 (90 Base) MCG/ACT inhaler Inhale 1-2 puffs into the lungs every 6 (six) hours as needed for wheezing or shortness of breath.   Yes [provider]  aspirin 81 MG EC tablet Take 81 mg by mouth daily.   Yes [provider]  atorvastatin (LIPITOR) 40 MG tablet Take 40 mg by mouth daily.   Yes [provider]  FLUoxetine (PROZAC) 20 MG capsule Take 20 mg by mouth daily.   Yes [provider]  fluticasone-salmeterol (ADVAIR) 250-50 MCG/ACT AEPB Inhale 1 puff into the lungs in the morning and at bedtime.   Yes [provider]  furosemide (LASIX) 20 MG tablet Take 40 mg by mouth 2 (two) times daily.   Yes [provider]  ipratropium (ATROVENT) 0.02 % nebulizer solution Inhale 0.5 mg into the lungs 3 (three) times daily.   Yes [provider]  senna-docusate (SENOKOT-S) 8.6-50 MG tablet Take 1 tablet by mouth daily.   Yes [provider]  tamsulosin (FLOMAX) 0.4 MG CAPS capsule Take 0.8 mg by mouth daily.   Yes [provider]  traZODone (DESYREL) 50 MG tablet Take 25 mg by mouth at bedtime.   Yes [provider]    Physical Exam: Vitals:   06/20/22 1900 06/20/22 1930 06/20/22 2030 06/20/22 2053  BP:  (!) 145/82 (!) 133/91 138/72  Pulse:  87 99 95  Resp: '16 15 17 20  '$ Temp:      TempSrc:      SpO2:  99% 98% 96%  Weight:      Height:       Physical Exam Vitals and nursing note reviewed.  Constitutional:      General: He is sleeping. He is not in acute distress. HENT:     Head: Normocephalic and atraumatic.  Cardiovascular:     Rate and Rhythm: Normal rate and regular rhythm.     Heart sounds: Normal heart sounds.  Pulmonary:     Effort: Pulmonary effort is normal.     Breath sounds: Normal breath sounds.  Abdominal:     Palpations: Abdomen is soft.     Tenderness: There is no abdominal tenderness.  Neurological:     Mental Status: He is easily aroused. Mental status  is at baseline.     Data Reviewed: Relevant notes from primary care and specialist visits, past discharge summaries as available in EHR, including Care Everywhere. Prior diagnostic testing as pertinent to current admission diagnoses Updated medications and problem lists for reconciliation ED course, including vitals, labs, imaging, treatment and response to treatment Triage notes, nursing and pharmacy notes and ED provider's notes Notable results as noted in HPI  Results for orders placed or performed during the hospital encounter of 06/20/22 (from the past 24 hour(s))  CBC with Differential     Status: Abnormal   Collection Time: 06/20/22 11:36 AM  Result Value Ref Range   WBC 5.6 4.0 - 10.5 K/uL   RBC 2.97 (L) 4.22 - 5.81 MIL/uL   Hemoglobin 8.3 (L) 13.0 - 17.0 g/dL   HCT 28.2 (L) 39.0 - 52.0 %   MCV 94.9 80.0 - 100.0 fL  MCH 27.9 26.0 - 34.0 pg   MCHC 29.4 (L) 30.0 - 36.0 g/dL   RDW 17.0 (H) 11.5 - 15.5 %   Platelets 200 150 - 400 K/uL   nRBC 0.0 0.0 - 0.2 %   Neutrophils Relative % 73 %   Neutro Abs 4.1 1.7 - 7.7 K/uL   Lymphocytes Relative 11 %   Lymphs Abs 0.6 (L) 0.7 - 4.0 K/uL   Monocytes Relative 10 %   Monocytes Absolute 0.5 0.1 - 1.0 K/uL   Eosinophils Relative 3 %   Eosinophils Absolute 0.1 0.0 - 0.5 K/uL   Basophils Relative 1 %   Basophils Absolute 0.1 0.0 - 0.1 K/uL   Immature Granulocytes 2 %   Abs Immature Granulocytes 0.13 (H) 0.00 - 0.07 K/uL  Comprehensive metabolic panel     Status: Abnormal   Collection Time: 06/20/22 11:36 AM  Result Value Ref Range   Sodium 141 135 - 145 mmol/L   Potassium 3.6 3.5 - 5.1 mmol/L   Chloride 103 98 - 111 mmol/L   CO2 30 22 - 32 mmol/L   Glucose, Bld 135 (H) 70 - 99 mg/dL   BUN 36 (H) 8 - 23 mg/dL   Creatinine, Ser 1.99 (H) 0.61 - 1.24 mg/dL   Calcium 9.2 8.9 - 10.3 mg/dL   Total Protein 7.4 6.5 - 8.1 g/dL   Albumin 4.3 3.5 - 5.0 g/dL   AST 17 15 - 41 U/L   ALT 14 0 - 44 U/L   Alkaline Phosphatase 67 38 - 126  U/L   Total Bilirubin 0.7 0.3 - 1.2 mg/dL   GFR, Estimated 33 (L) >60 mL/min   Anion gap 8 5 - 15  Troponin I (High Sensitivity)     Status: Abnormal   Collection Time: 06/20/22 11:36 AM  Result Value Ref Range   Troponin I (High Sensitivity) 33 (H) <18 ng/L  Magnesium     Status: Abnormal   Collection Time: 06/20/22 11:36 AM  Result Value Ref Range   Magnesium 2.5 (H) 1.7 - 2.4 mg/dL  Brain natriuretic peptide     Status: Abnormal   Collection Time: 06/20/22 11:36 AM  Result Value Ref Range   B Natriuretic Peptide 409.7 (H) 0.0 - 100.0 pg/mL  Troponin I (High Sensitivity)     Status: Abnormal   Collection Time: 06/20/22  3:38 PM  Result Value Ref Range   Troponin I (High Sensitivity) 35 (H) <18 ng/L  Procalcitonin - Baseline     Status: None   Collection Time: 06/20/22  3:38 PM  Result Value Ref Range   Procalcitonin <0.10 ng/mL  Resp Panel by RT-PCR (Flu A&B, Covid) Anterior Nasal Swab     Status: None   Collection Time: 06/20/22  4:26 PM   Specimen: Anterior Nasal Swab  Result Value Ref Range   SARS Coronavirus 2 by RT PCR NEGATIVE NEGATIVE   Influenza A by PCR NEGATIVE NEGATIVE   Influenza B by PCR NEGATIVE NEGATIVE  Lactic acid, plasma     Status: None   Collection Time: 06/20/22  8:17 PM  Result Value Ref Range   Lactic Acid, Venous 0.8 0.5 - 1.9 mmol/L     Assessment and Plan: * Acute on chronic diastolic CHF (congestive heart failure) (HCC) Patient presents with shortness of breath, dyspnea on exertion lower extremity edema, pleural effusion, elevated troponin of 35 which could be demand ischemia as well as BNP elevated at 409 Trial of IV Lasix Daily weights with intake  and output monitoring Echocardiogram  Pleural effusion on left IR consulted for thoracentesis to help determine etiology of pleural effusion CT chest showing moderate-sized left pleural effusion some of which is loculated suggesting parapneumonic effusion or pleural metastatic disease N.p.o.  after midnight.  SCD for DVT prophylaxis  Acute on chronic dyspnea Multifactorial related to pleural effusion, CHF with exacerbation, possible malignancy based on CT findings with chronic physical deconditioning.  Lower suspicion for infectious process and lower suspicion for PE O2 requirement is at baseline so continue home O2 Treat primary suspected etiologies of CHF and pleural effusion of undetermined etiology as outlined below  CAD with hx of CABG Elevated troponin Troponin of 35 but without complaints of chest pain and with nonacute EKG Elevated troponin likely related to demand ischemia from ongoing dyspnea Continue aspirin, atorvastatin  Multiple pulmonary nodules Patient has history of multiple stable pulmonary nodules per review of PCPs note. Current CT was not compared to prior stating none was available Current CT showing multiple nodules somewhat spiculated margins Suggest reread with comparison to prior, otherwise recommendation for follow-up PET CT and tissue sampling is recommended by radiologist Follow-up cytology on pleural tap  Chronic back pain Physical deconditioning and ambulatory dysfunction At baseline ambulates with a walker Hydrocodone for pain PT/OT TOC consults  Dementia without behavioral disturbance ([place]) Delirium precautions --Get up during the day --Keep blinds open and lights on during daylight hours --Minimize the use of opioids/benzodiazepines --Reorient the patient frequently, provide easily visible clock and calendar --Provide sensory aids like glasses, hearing aids --Encourage ambulation, regular activities and visitors to maintain cognitive stimulation  --Patient would benefit from having family members at bedside to reinforce his orientation.    History of stroke Continue aspirin and statin  Stage 3b chronic kidney disease ([place]) Renal function at baseline  History of AAA (abdominal aortic aneurysm) repair S/p celiac artery  stenting Common iliac artery aneurysm PAD Continue aspirin and statin  COPD (chronic obstructive pulmonary disease) with emphysema (HCC) Chronic respiratory failure with hypoxia Not acutely exacerbated Continue home inhaler DuoNebs as needed Patient's O2 requirement is at baseline Continue supplemental oxygen      Advance Care Planning:   Code Status: DNR   Consults: Cardiology, Dr Stanford Breed  Family Communication: Daughter, Ms [name], wife Cristoval Teall  Severity of Illness: The appropriate patient status for this patient is INPATIENT. Inpatient status is judged to be reasonable and necessary in order to provide the required intensity of service to ensure the patient's safety. The patient's presenting symptoms, physical exam findings, and initial radiographic and laboratory data in the context of their chronic comorbidities is felt to place them at high risk for further clinical deterioration. Furthermore, it is not anticipated that the patient will be medically stable for discharge from the hospital within 2 midnights of admission.   * I certify that at the point of admission it is my clinical judgment that the patient will require inpatient hospital care spanning beyond 2 midnights from the point of admission due to high intensity of service, high risk for further deterioration and high frequency of surveillance required.*  Author: Athena Masse, MD 06/20/2022 9:43 PM  For on call review [email or web address].

## 2022-06-21 ENCOUNTER — Inpatient Hospital Stay: Payer: Medicare Other

## 2022-06-21 ENCOUNTER — Observation Stay: Payer: Medicare Other

## 2022-06-21 ENCOUNTER — Inpatient Hospital Stay (HOSPITAL_COMMUNITY)
Admit: 2022-06-21 | Discharge: 2022-06-21 | Disposition: A | Discharge status: Home / Self Care | Payer: Medicare Other | Attending: Internal Medicine | Admitting: Internal Medicine

## 2022-06-21 ENCOUNTER — Observation Stay: Admit: 2022-06-21 | Payer: Medicare Other

## 2022-06-21 DIAGNOSIS — R778 Other specified abnormalities of plasma proteins: Secondary | ICD-10-CM | POA: Diagnosis not present

## 2022-06-21 DIAGNOSIS — J918 Pleural effusion in other conditions classified elsewhere: Secondary | ICD-10-CM | POA: Diagnosis present

## 2022-06-21 DIAGNOSIS — I5031 Acute diastolic (congestive) heart failure: Secondary | ICD-10-CM | POA: Diagnosis not present

## 2022-06-21 DIAGNOSIS — J9691 Respiratory failure, unspecified with hypoxia: Secondary | ICD-10-CM | POA: Diagnosis present

## 2022-06-21 DIAGNOSIS — I739 Peripheral vascular disease, unspecified: Secondary | ICD-10-CM | POA: Diagnosis present

## 2022-06-21 DIAGNOSIS — J910 Malignant pleural effusion: Secondary | ICD-10-CM | POA: Diagnosis present

## 2022-06-21 DIAGNOSIS — J9611 Chronic respiratory failure with hypoxia: Secondary | ICD-10-CM | POA: Diagnosis present

## 2022-06-21 DIAGNOSIS — Z79899 Other long term (current) drug therapy: Secondary | ICD-10-CM | POA: Diagnosis not present

## 2022-06-21 DIAGNOSIS — N400 Enlarged prostate without lower urinary tract symptoms: Secondary | ICD-10-CM | POA: Diagnosis present

## 2022-06-21 DIAGNOSIS — I5033 Acute on chronic diastolic (congestive) heart failure: Secondary | ICD-10-CM

## 2022-06-21 DIAGNOSIS — Z20822 Contact with and (suspected) exposure to covid-19: Secondary | ICD-10-CM | POA: Diagnosis present

## 2022-06-21 DIAGNOSIS — J9601 Acute respiratory failure with hypoxia: Secondary | ICD-10-CM | POA: Diagnosis present

## 2022-06-21 DIAGNOSIS — I130 Hypertensive heart and chronic kidney disease with heart failure and stage 1 through stage 4 chronic kidney disease, or unspecified chronic kidney disease: Secondary | ICD-10-CM | POA: Diagnosis present

## 2022-06-21 DIAGNOSIS — N1832 Chronic kidney disease, stage 3b: Secondary | ICD-10-CM | POA: Diagnosis present

## 2022-06-21 DIAGNOSIS — Z951 Presence of aortocoronary bypass graft: Secondary | ICD-10-CM | POA: Diagnosis not present

## 2022-06-21 DIAGNOSIS — Z9981 Dependence on supplemental oxygen: Secondary | ICD-10-CM | POA: Diagnosis not present

## 2022-06-21 DIAGNOSIS — J90 Pleural effusion, not elsewhere classified: Secondary | ICD-10-CM

## 2022-06-21 DIAGNOSIS — J439 Emphysema, unspecified: Secondary | ICD-10-CM

## 2022-06-21 DIAGNOSIS — I248 Other forms of acute ischemic heart disease: Secondary | ICD-10-CM | POA: Diagnosis present

## 2022-06-21 DIAGNOSIS — E7800 Pure hypercholesterolemia, unspecified: Secondary | ICD-10-CM | POA: Diagnosis present

## 2022-06-21 DIAGNOSIS — F03A0 Unspecified dementia, mild, without behavioral disturbance, psychotic disturbance, mood disturbance, and anxiety: Secondary | ICD-10-CM | POA: Diagnosis present

## 2022-06-21 DIAGNOSIS — J9383 Other pneumothorax: Secondary | ICD-10-CM | POA: Diagnosis not present

## 2022-06-21 DIAGNOSIS — Z87891 Personal history of nicotine dependence: Secondary | ICD-10-CM | POA: Diagnosis not present

## 2022-06-21 DIAGNOSIS — Z66 Do not resuscitate: Secondary | ICD-10-CM | POA: Diagnosis present

## 2022-06-21 DIAGNOSIS — D489 Neoplasm of uncertain behavior, unspecified: Secondary | ICD-10-CM | POA: Diagnosis present

## 2022-06-21 DIAGNOSIS — I723 Aneurysm of iliac artery: Secondary | ICD-10-CM | POA: Diagnosis present

## 2022-06-21 DIAGNOSIS — G8929 Other chronic pain: Secondary | ICD-10-CM | POA: Diagnosis present

## 2022-06-21 DIAGNOSIS — D649 Anemia, unspecified: Secondary | ICD-10-CM | POA: Diagnosis present

## 2022-06-21 DIAGNOSIS — J432 Centrilobular emphysema: Secondary | ICD-10-CM | POA: Diagnosis not present

## 2022-06-21 DIAGNOSIS — R918 Other nonspecific abnormal finding of lung field: Secondary | ICD-10-CM

## 2022-06-21 DIAGNOSIS — I252 Old myocardial infarction: Secondary | ICD-10-CM | POA: Diagnosis not present

## 2022-06-21 LAB — GLUCOSE, PLEURAL OR PERITONEAL FLUID: Glucose, Fluid: 102 mg/dL

## 2022-06-21 LAB — ECHOCARDIOGRAM COMPLETE
Height: 72 in
S' Lateral: 4.4 cm
Weight: 2878.33 oz

## 2022-06-21 LAB — BODY FLUID CELL COUNT WITH DIFFERENTIAL
Eos, Fluid: 0 %
Lymphs, Fluid: 83 %
Monocyte-Macrophage-Serous Fluid: 14 %
Neutrophil Count, Fluid: 3 %
Total Nucleated Cell Count, Fluid: 562 cu mm

## 2022-06-21 LAB — CBC WITH DIFFERENTIAL/PLATELET
Abs Immature Granulocytes: 0.1 10*3/uL — ABNORMAL HIGH (ref 0.00–0.07)
Basophils Absolute: 0 10*3/uL (ref 0.0–0.1)
Basophils Relative: 1 %
Eosinophils Absolute: 0.2 10*3/uL (ref 0.0–0.5)
Eosinophils Relative: 3 %
HCT: 25.1 % — ABNORMAL LOW (ref 39.0–52.0)
Hemoglobin: 7.6 g/dL — ABNORMAL LOW (ref 13.0–17.0)
Immature Granulocytes: 2 %
Lymphocytes Relative: 14 %
Lymphs Abs: 0.8 10*3/uL (ref 0.7–4.0)
MCH: 28.6 pg (ref 26.0–34.0)
MCHC: 30.3 g/dL (ref 30.0–36.0)
MCV: 94.4 fL (ref 80.0–100.0)
Monocytes Absolute: 0.5 10*3/uL (ref 0.1–1.0)
Monocytes Relative: 9 %
Neutro Abs: 4.2 10*3/uL (ref 1.7–7.7)
Neutrophils Relative %: 71 %
Platelets: 186 10*3/uL (ref 150–400)
RBC: 2.66 MIL/uL — ABNORMAL LOW (ref 4.22–5.81)
RDW: 17.1 % — ABNORMAL HIGH (ref 11.5–15.5)
WBC: 5.8 10*3/uL (ref 4.0–10.5)
nRBC: 0 % (ref 0.0–0.2)

## 2022-06-21 LAB — BASIC METABOLIC PANEL
Anion gap: 8 (ref 5–15)
BUN: 35 mg/dL — ABNORMAL HIGH (ref 8–23)
CO2: 30 mmol/L (ref 22–32)
Calcium: 8.9 mg/dL (ref 8.9–10.3)
Chloride: 102 mmol/L (ref 98–111)
Creatinine, Ser: 2.03 mg/dL — ABNORMAL HIGH (ref 0.61–1.24)
GFR, Estimated: 32 mL/min — ABNORMAL LOW (ref >60)
Glucose, Bld: 172 mg/dL — ABNORMAL HIGH (ref 70–99)
Potassium: 3.8 mmol/L (ref 3.5–5.1)
Sodium: 140 mmol/L (ref 135–145)

## 2022-06-21 LAB — PROTEIN, PLEURAL OR PERITONEAL FLUID: Total protein, fluid: 3.6 g/dL

## 2022-06-21 LAB — LACTATE DEHYDROGENASE, PLEURAL OR PERITONEAL FLUID: LD, Fluid: 127 U/L — ABNORMAL HIGH (ref 3–23)

## 2022-06-21 MED ORDER — POLYETHYLENE GLYCOL 3350 17 G PO PACK
17.0000 g | PACK | Freq: Every day | ORAL | Status: DC
  Administered 2022-06-21 – 2022-06-24 (×4): 17 g via ORAL
  Filled 2022-06-21 (×4): qty 1

## 2022-06-21 MED ORDER — ORAL CARE MOUTH RINSE: 15.0000 mL | OROMUCOSAL | Status: DC | PRN

## 2022-06-21 NOTE — Evaluation (Signed)
Occupational Therapy Evaluation Patient Details Name: Walter Moody MRN: [id number] DOB: 1941/01/08 Today's Date: 06/21/2022   History of Present Illness Walter Moody is a 81 y.o. 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   Clinical Impression   Patient seen for OT evaluation, daughter present in room. Patient presenting with SOB, chronic back pain, decreased strength, and decreased endurance impacting safety and independence in ADLs. Patient with cognitive deficits at baseline, daughter present to confirm PLOF. At baseline, patient was receiving assistance from wife for ADLs/IADLs and completing functional mobility at household distances using a rollator. Patient currently functioning at supervision for bed mobility, Min guard for functional transfers using RW, Min A for LB dressing, set up A for UB dressing, and set up A for seated grooming tasks. Patient will benefit from acute OT to increase overall independence in the areas of ADLs and functional mobility in order to safely discharge home. Pt could benefit from [facility] following D/C to decrease falls risk, improve balance, and maximize independence in self-care within own home environment.      Recommendations for follow up therapy are one component of a multi-disciplinary discharge planning process, led by the attending  physician.  Recommendations may be updated based on patient status, additional functional criteria and insurance authorization.   Follow Up Recommendations  Home health OT    Assistance Recommended at Discharge Frequent or constant Supervision/Assistance  Patient can return home with the following A little help with bathing/dressing/bathroom;A little help with walking and/or transfers;Assistance with cooking/housework;Direct supervision/assist for financial management;Assist for transportation;Help with stairs or ramp for entrance;Direct supervision/assist for medications management    Functional Status Assessment  Patient has had a recent decline in their functional status and demonstrates the ability to make significant improvements in function in a reasonable and predictable amount of time.  Equipment Recommendations  BSC/3in1    Recommendations for Other Services       Precautions / Restrictions Precautions Precautions: Fall Restrictions Weight Bearing Restrictions: No      Mobility Bed Mobility Overal bed mobility: Needs Assistance Bed Mobility: Supine to Sit, Sit to Supine     Supine to sit: HOB elevated, Supervision Sit to supine: Supervision        Transfers Overall transfer level: Needs assistance Equipment used: Rolling walker (2 wheels) Transfers: Sit to/from Stand Sit to Stand: Min guard                  Balance Overall balance assessment: Needs assistance Sitting-balance support: Feet supported, No upper extremity supported Sitting balance-Leahy Scale: Fair Sitting balance - Comments: Min guard for dynamic sitting balance during LB dressing   Standing balance support: During functional activity, No upper extremity supported Standing balance-Leahy Scale: Fair Standing balance comment: Min guard for dynamic standing balance during clothing management  ADL either performed or assessed with clinical judgement    ADL Overall ADL's : Needs assistance/impaired     Grooming: Wash/dry face;Sitting;Set up;Supervision/safety           Upper Body Dressing : Set up;Sitting Upper Body Dressing Details (indicate cue type and reason): Set up A to don hospital gown Lower Body Dressing: Minimal assistance;Sitting/lateral leans;Sit to/from stand;Min guard Lower Body Dressing Details (indicate cue type and reason): Min A to thread BLEs through underwear, able to hike over B hips in standing with Min guard Toilet Transfer: Min guard;Rolling walker (2 wheels) Toilet Transfer Details (indicate cue type and reason): simulated with STS from EOB Toileting- Clothing Manipulation and Hygiene: Min guard;Sit to/from stand               Vision Patient Visual Report: No change from baseline       Perception     Praxis      Pertinent Vitals/Pain Pain Assessment Pain Assessment: 0-10 Pain Score: 10-Worst pain ever Pain Location: back (chronic) Pain Descriptors / Indicators: Constant, Discomfort Pain Intervention(s): Limited activity within patient's tolerance, RN gave pain meds during session, Repositioned     Hand Dominance     Extremity/Trunk Assessment Upper Extremity Assessment Upper Extremity Assessment: Generalized weakness   Lower Extremity Assessment Lower Extremity Assessment: Generalized weakness       Communication Communication Communication: No difficulties   Cognition Arousal/Alertness: Awake/alert Behavior During Therapy: WFL for tasks assessed/performed Overall Cognitive Status: History of cognitive impairments - at baseline                                 General Comments: Pt with dementia at baseline, however, A&Ox4.     General Comments  Pt experiencing dyspnea on exertion, reported feeling SOB. SpO2 91% on 2L O2 via [place] while sitting EOB, educated on pursed lip breathing, improved with time.    Exercises     Shoulder Instructions      Home Living  Family/patient expects to be discharged to:: Private residence Living Arrangements: Spouse/significant other Available Help at Discharge: Family;Available 24 hours/day Type of Home: Mobile home Home Access: Ramped entrance     Home Layout: One level     Bathroom Shower/Tub: Teacher, early years/pre: Handicapped height     Home Equipment: Rollator (4 wheels);Shower seat   Additional Comments: Wife home 24/7 (has COPD), daughter lives ~15 min away      Prior Functioning/Environment Prior Level of Function : Needs assist (Daughter present to confirm information)       Physical Assist : ADLs (physical)   ADLs (physical): IADLs Mobility Comments: Mod I with rollator for household distances, does not really leave the house ever ADLs Comments: Pt reports wife helps him with "everything" including ADLs/IADLs. Wife or daughter drives pt to appointments, wife completes cooking/cleaning/laundry, receives meals on wheels.        OT Problem List: Decreased strength;Decreased knowledge of use of DME or AE;Decreased activity tolerance;Impaired balance (sitting and/or standing);Decreased cognition;Decreased safety awareness;Pain      OT Treatment/Interventions: Self-care/ADL training;Therapeutic exercise;Patient/family education;Balance training;Energy conservation;Therapeutic activities;DME and/or AE instruction;Cognitive remediation/compensation    OT Goals(Current goals can be found in the care plan section) Acute Rehab OT Goals Patient Stated Goal: to go home OT Goal Formulation: With patient/family Time For Goal Achievement: 07/05/22 Potential to Achieve Goals: Good ADL Goals Pt Will Perform Grooming: with supervision;standing Pt Will Perform Upper Body  Dressing: with supervision;sitting Pt Will Perform Lower Body Dressing: with supervision;sitting/lateral leans;sit to/from stand Pt Will Transfer to Toilet: with supervision;ambulating;grab bars;regular height toilet Pt  Will Perform Toileting - Clothing Manipulation and hygiene: with supervision;sit to/from stand Additional ADL Goal #1: Pt will verbalize 2-3 Energy conservation techniques with Min VC  OT Frequency: Min 2X/week    Co-evaluation              AM-PAC OT "6 Clicks" Daily Activity     Outcome Measure Help from another person eating meals?: None Help from another person taking care of personal grooming?: A Little Help from another person toileting, which includes using toliet, bedpan, or urinal?: A Little Help from another person bathing (including washing, rinsing, drying)?: A Lot Help from another person to put on and taking off regular upper body clothing?: A Little Help from another person to put on and taking off regular lower body clothing?: A Little 6 Click Score: 18   End of Session Equipment Utilized During Treatment: Gait belt;Rolling walker (2 wheels) Nurse Communication: Mobility status  Activity Tolerance: Patient tolerated treatment well;Patient limited by fatigue Patient left: in bed;with call bell/phone within reach;with bed alarm set  OT Visit Diagnosis: Unsteadiness on feet (R26.81);Muscle weakness (generalized) (M62.81);Pain;Other symptoms and signs involving cognitive function Pain - part of body:  (back)                Time: 1600-1630 OT Time Calculation (min): 30 min Charges:  OT General Charges $OT Visit: 1 Visit OT Evaluation $OT Eval Low Complexity: 1 Low  [facility] MS, OTR/L ascom [phone]  06/21/22, 5:08 PM

## 2022-06-21 NOTE — Progress Notes (Signed)
Cardiology Consultation   Patient ID: Walter Moody MRN: [id number]; DOB: 1941-01-20  Admit date: 06/20/2022 Date of Consult: 06/21/2022  PCP:  Jacklynn Barnacle, MD   [facility] Cardiologist: [facility] cardiology   Patient Profile:   Walter Moody is a 81 y.o. male with a hx of  who is being seen 06/21/2022 for the evaluation of shortness of breath at the request of Dr. Damita Dunnings.  History of Present Illness:   Walter Moody is an 81 year old male with history of CAD/CABG about 10 years ago, HFpEF, COPD on 2 L home oxygen, AAA s/p EVAR 2005, PAD, hypertension, hyperlipidemia presenting with 2-week history of worsening shortness of breath and cough.  States cough was nonproductive, denies chest pain.  Endorsed taking all medicines as prescribed.  Upon admission, he underwent a chest CT showing moderate left pleural effusion.  2 nodules noted in the lung on chest CT, 1 in the left lower lobe, and right upper lobe the other, suggesting neoplastic process.  He underwent thoracentesis today with 1.7 L of fluid removed.  Shortness of breath improved.  Started on oral Lasix, net 950 cc so far.  Troponins were 35, 33, EKG shows sinus rhythm with first-degree AV block.   Past Medical History:  Diagnosis Date   Arthritis    Cancer ([place])    CHF (congestive heart failure) (H[facility])    COPD (chronic obstructive pulmonary disease) ([place])    Heart attack ([place])    Hypercholesteremia    Hypertension    Stroke [facility])     Past Surgical History:  Procedure Laterality Date   BACK SURGERY     CARDIAC SURGERY     CORONARY ARTERY BYPASS GRAFT     VASCULAR SURGERY       Home Medications:  Prior to Admission medications   Medication Sig Start Date End Date Taking? Authorizing Provider  acetaminophen (TYLENOL) 500 MG tablet Take 500-1,000 mg by mouth every 6 (six) hours as needed for mild pain or moderate pain.   Yes [provider]  albuterol (PROVENTIL) (2.5  MG/3ML) 0.083% nebulizer solution Take 2.5 mg by nebulization 4 (four) times daily.   Yes [provider]  albuterol (VENTOLIN HFA) 108 (90 Base) MCG/ACT inhaler Inhale 1-2 puffs into the lungs every 6 (six) hours as needed for wheezing or shortness of breath.   Yes [provider]  aspirin 81 MG EC tablet Take 81 mg by mouth daily.   Yes [provider]  atorvastatin (LIPITOR) 40 MG tablet Take 40 mg by mouth daily.   Yes [provider]  FLUoxetine (PROZAC) 20 MG capsule Take 20 mg by mouth daily.   Yes [provider]  fluticasone-salmeterol (ADVAIR) 250-50 MCG/ACT AEPB Inhale 1 puff into the lungs in the morning and at bedtime.   Yes [provider]  furosemide (LASIX) 20 MG tablet Take 40 mg by mouth 2 (two) times daily.   Yes [provider]  ipratropium (ATROVENT) 0.02 % nebulizer solution Inhale 0.5 mg into the lungs 3 (three) times daily.   Yes [provider]  senna-docusate (SENOKOT-S) 8.6-50 MG tablet Take 1 tablet by mouth daily.   Yes [provider]  tamsulosin (FLOMAX) 0.4 MG CAPS capsule Take 0.8 mg by mouth daily.   Yes [provider]  traZODone (DESYREL) 50 MG tablet Take 25 mg by mouth at bedtime.   Yes [provider]    Inpatient Medications: Scheduled Meds:  aspirin EC  81 mg  Oral Daily   atorvastatin  40 mg Oral Daily   enoxaparin (LOVENOX) injection  40 mg Subcutaneous Q24H   FLUoxetine  20 mg Oral Daily   furosemide  40 mg Oral BID   mometasone-formoterol  2 puff Inhalation BID   polyethylene glycol  17 g Oral Daily   senna-docusate  1 tablet Oral Daily   tamsulosin  0.8 mg Oral Daily   traZODone  25 mg Oral QHS   Continuous Infusions:  PRN Meds: acetaminophen **OR** acetaminophen, albuterol, HYDROcodone-acetaminophen, ipratropium-albuterol, morphine injection, ondansetron **OR** ondansetron (ZOFRAN) IV, mouth rinse  Allergies:   No Known Allergies  Social  History:   Social History   Socioeconomic History   Marital status: Married    Spouse name: Not on file   Number of children: Not on file   Years of education: Not on file   Highest education level: Not on file  Occupational History   Not on file  Tobacco Use   Smoking status: Former    Types: Cigarettes   Smokeless tobacco: Never  Substance and Sexual Activity   Alcohol use: Not Currently   Drug use: Never   Sexual activity: Not on file  Other Topics Concern   Not on file  Social History Narrative   Not on file   Social Determinants of Health   Financial Resource Strain: Not on file  Food Insecurity: No Food Insecurity (06/21/2022)   Hunger Vital Sign    Worried About Running Out of Food in the Last Year: Never true    Ran Out of Food in the Last Year: Never true  Transportation Needs: No Transportation Needs (06/20/2022)   PRAPARE - Hydrologist (Medical): No    Lack of Transportation (Non-Medical): No  Physical Activity: Not on file  Stress: Not on file  Social Connections: Not on file  Intimate Partner Violence: Not At Risk (06/20/2022)   Humiliation, Afraid, Rape, and Kick questionnaire    Fear of Current or Ex-Partner: No    Emotionally Abused: No    Physically Abused: No    Sexually Abused: No    Family History:   History reviewed. No pertinent family history.   ROS:  Please see the history of present illness.   All other ROS reviewed and negative.     Physical Exam/Data:   Vitals:   06/21/22 0850 06/21/22 0852 06/21/22 1014 06/21/22 1037  BP:  130/63 (!) 144/59 (!) 125/56  Pulse: 73 75 83 95  Resp:  '16 16 16  '$ Temp: 97.9 F (36.6 C)     TempSrc: Oral     SpO2: 100%  96% 93%  Weight:      Height:        Intake/Output Summary (Last 24 hours) at 06/21/2022 1408 Last data filed at 06/21/2022 1358 Gross per 24 hour  Intake 600 ml  Output 950 ml  Net -350 ml      06/21/2022    4:41 AM 06/20/2022   11:21 AM 05/22/2022   11:48  AM  Last 3 Weights  Weight (lbs) 179 lb 14.3 oz 179 lb 14.3 oz 180 lb  Weight (kg) 81.6 kg 81.6 kg 81.647 kg     Body mass index is 24.4 kg/m.  General:  Well nourished, well developed, in no acute distress HEENT: normal Neck: no JVD Vascular: No carotid bruits;  Cardiac:  normal S1, S2; RRR;  Lungs: Diminished breath sounds bilaterally Abd: soft, nontender, no hepatomegaly  Ext: no  edema Musculoskeletal:  No deformities, BUE and BLE strength normal and equal Skin: warm and dry  Neuro:  CNs 2-12 intact, no focal abnormalities noted Psych:  Normal affect   EKG:  The EKG was personally reviewed and demonstrates: Normal sinus rhythm Telemetry:  Telemetry was personally reviewed and demonstrates: Sinus rhythm  Relevant CV Studies: Echocardiogram obtained today, EF about 50%  Laboratory Data:  High Sensitivity Troponin:   Recent Labs  Lab 06/20/22 1136 06/20/22 1538  TROPONINIHS 33* 35*     Chemistry Recent Labs  Lab 06/20/22 1136 06/21/22 1145  NA 141 140  K 3.6 3.8  CL 103 102  CO2 30 30  GLUCOSE 135* 172*  BUN 36* 35*  CREATININE 1.99* 2.03*  CALCIUM 9.2 8.9  MG 2.5*  --   GFRNONAA 33* 32*  ANIONGAP 8 8    Recent Labs  Lab 06/20/22 1136  PROT 7.4  ALBUMIN 4.3  AST 17  ALT 14  ALKPHOS 67  BILITOT 0.7   Lipids No results for input(s): "CHOL", "TRIG", "HDL", "LABVLDL", "LDLCALC", "CHOLHDL" in the last 168 hours.  Hematology Recent Labs  Lab 06/20/22 1136 06/21/22 1145  WBC 5.6 5.8  RBC 2.97* 2.66*  HGB 8.3* 7.6*  HCT 28.2* 25.1*  MCV 94.9 94.4  MCH 27.9 28.6  MCHC 29.4* 30.3  RDW 17.0* 17.1*  PLT 200 186   Thyroid No results for input(s): "TSH", "FREET4" in the last 168 hours.  BNP Recent Labs  Lab 06/20/22 1136  BNP 409.7*    DDimer No results for input(s): "DDIMER" in the last 168 hours.   Radiology/Studies:  DG Chest Port 1 View  Result Date: 06/21/2022 CLINICAL DATA:  Status post thoracentesis on the left. EXAM: PORTABLE CHEST 1  VIEW COMPARISON:  CT and radiographs 06/20/2022. FINDINGS: 1039 hours. Interval near complete evacuation of the previously demonstrated left-sided pleural effusion. There is a small left-sided pneumothorax with apical and basilar components, partially ex vacuo. No resulting mediastinal shift. There is improved aeration of the left lung with mild residual left basilar atelectasis. The right lung is clear. The heart size and mediastinal contours are stable post median sternotomy. Previous cervical fusion noted. IMPRESSION: Small left-sided pneumothorax post thoracentesis, predominately ex vacuo. No tension component identified. Critical Value/emergent results were communicated at the time of interpretation on 06/21/2022 at 11:01 am to provider [facility] , who acknowledged these results. Electronically Signed   By: Richardean Sale M.D.   On: 06/21/2022 11:02   CT Chest Wo Contrast  Result Date: 06/20/2022 CLINICAL DATA:  Cough, shortness of breath x2 weeks EXAM: CT CHEST WITHOUT CONTRAST TECHNIQUE: Multidetector CT imaging of the chest was performed following the standard protocol without IV contrast. RADIATION DOSE REDUCTION: This exam was performed according to the departmental dose-optimization program which includes automated exposure control, adjustment of the mA and/or kV according to patient size and/or use of iterative reconstruction technique. COMPARISON:  None Available. FINDINGS: Cardiovascular: Extensive coronary artery calcifications are seen. There is previous coronary artery bypass. Heart is enlarged in size. Mediastinum/Nodes: No significant lymphadenopathy is seen. Lungs/Pleura: Centrilobular and panlobular emphysema is seen. In image 65 of series 5, there is 1.5 x 1.3 cm nodule with spiculated margins in the anterolateral aspect of right upper lobe. There is moderate to large left pleural effusion part of which appears to be loculated in the lateral aspect of left lower lung field. There is no  significant right pleural effusion. Linear infiltrates are seen in lingula. In image 112  of series 3, there is 2.8 x 1.7 cm pleural-based nodular infiltrate in left lower lobe. In image 109, there is 8 mm pleural-based nodule in right lower lobe. Fibrotic changes are noted in right middle lobe, anterior left upper lobe and anterior right upper lobe. There is no pneumothorax. Upper Abdomen: There is previous endovascular stent repair in abdominal aorta. A stent is noted in proximal courses of celiac and SMA. There is 1.4 cm calculus in the upper pole of left kidney. Musculoskeletal: Decrease in height of lower endplate of body of L2 vertebra has not changed. There is mild decrease in height of upper endplate of body of L1 vertebra with no significant change. IMPRESSION: There is 2.8 cm pleural-based nodular density in left lower lobe. This may suggest atelectasis/pneumonia and possibly neoplastic process. There is a 1.5 cm nodule with spiculated margins in right upper lobe. There is 8 mm pleural-based nodule in right lower lobe. Possibility of malignant neoplastic process is not excluded. Follow-up PET-CT and tissue sampling as warranted should be considered. Moderate sized left pleural effusion some of which is loculated along the lateral aspect of left lower lung field. This may suggest parapneumonic effusion or pleural metastatic disease. Severe COPD.  No significant lymphadenopathy is seen in mediastinum. Extensive coronary artery disease with previous coronary bypass surgery. Severe atherosclerotic changes are noted in abdominal aorta and its major branches. There is a 1.4 cm left renal calculus. Other findings as described in the body of the report. Electronically Signed   By: Elmer Picker M.D.   On: 06/20/2022 17:36   DG Chest 2 View  Result Date: 06/20/2022 CLINICAL DATA:  Shortness of breath EXAM: CHEST - 2 VIEW COMPARISON:  None Available. FINDINGS: Cardiomegaly status post median sternotomy and  CABG. Moderate left pleural effusion associated atelectasis or consolidation. Diffuse bilateral interstitial opacity. Underlying emphysema. Disc degenerative disease of the thoracic spine. IMPRESSION: 1. Moderate left pleural effusion and associated atelectasis or consolidation. 2. Diffuse bilateral interstitial opacity, consistent with edema and/or infection. No focal airspace opacity. 3. Underlying emphysema. Electronically Signed   By: Delanna Ahmadi M.D.   On: 06/20/2022 12:15     Assessment and Plan:   HFpEF -Left pleural effusion s/p thoracentesis -Continue Lasix 40 mg twice daily -Echo with preserved EF -Evaluate fluid cytology, lung nodules suspicious for malignancy -No additional cardiac testing or intervention planned  2.  CAD/CABG -Denies chest pain -Aspirin, Lipitor.  3.  Pleural effusion s/p thoracentesis, lung nodules suspicious for malignancy -Shortness of breath improved after thoracentesis -1.7 cc fluid removed -Follow-up fluid analysis, fluid cytology  4.  COPD on oxygen -Management as per medicine team  Total encounter time more than 85 minutes  Greater than 50% was spent in counseling and coordination of care with the patient    Signed, Kate Sable, MD  06/21/2022 2:08 PM

## 2022-06-21 NOTE — Consult Note (Signed)
Cardiology Consultation   Patient ID: Walter Moody MRN: [id number]; DOB: [birth year]  Admit date: 06/20/2022 Date of Consult: 06/21/2022  PCP:  Jacklynn Barnacle, MD   [place] Providers Cardiologist:  New to [place] done by Dr. Carlene Coria here to update MD or APP on Care Team, Refresh:1}     Patient Profile:   Walter Moody is a 81 y.o. male with a hx of COPD with chronic respiratory failure on home O2 at 2L, CAD s/p CABG, history of left PCA infarct, BPH, PAD, AAA s/p endovascular repair, left common iliac artery aneurysm, s/p celiac artery stenting, diastolic congestive heart failure, CKD stage IIIb, pulmonary nodules, cognitive dysfunction, and chronic anemia who is being seen 06/21/2022 for the evaluation of CHF exacerbation at the request of Dr. Damita Dunnings.  History of Present Illness:   Walter Moody is a 81 year old male with a history of COPD with chronic respiratory failure on home O2 with 2 L of home oxygen, CAD status post CABG, history of left PCA infarct (2021), BPH, PAD, AAA status post endovascular repair, left common iliac artery aneurysm, status post iliac artery stenting, diastolic congestive heart failure, chronic back pain, CKD stage IIIb, stable pulmonary nodules, cognitive dysfunction, chronic anemia who presented to the emergency department with a several week history of shortness of breath, increasing dyspnea on exertion, decreased ability to ambulate with his walker, and ongoing chronic back pain.  He had previously been evaluated by his PCP on 04/26/2022 at which time he had lower extremity edema and dyspnea but with complaints of functional decline.  Patient is followed at [facility].  He presented to the [facility] emergency department accompanied by his daughter who reports he had 2 weeks of fairly progressive increasing shortness of breath, who they thought he should have been evaluated prior, but he had declined. His breathing did  decline last evening and he was convinced to come into the emergency department.  He states he had been coughing and feels fairly short of breath typically when he lies down.  He does continue to use chronic oxygen at home.  He has not noticed any fevers or wheezing but does endorse a nonproductive cough.  Denies any chest pain, palpitations or chest pressure.  Initial vital signs: Blood pressure 142/82, pulse 78, respirations 22, temperature of 98  Pertinent labs, RBC 2.97, hemoglobin 8.3, HCT 28.2, glucose of 135, BUN 36, creatinine 1.99, GFR 33, magnesium of 2.5, BNP of four 9.7, high-sensitivity troponin 35 and 33  Imaging: Chest x-ray revealed moderate pleural effusion with associated atelectasis or consolidation, diffuse bilateral interstitial opacity, consistent with edema and/or infection, underlying emphysema; CT of the chest without contrast revealed a 2.8 cm pleural-based nodular density in the left lower lobe that may suggest atelectasis/pneumonia and possibly neoplastic process.  There is 1.5 cm nodule in this.  Lactated margins of the right upper lobe, there is an 8 mm pleural-based nodule in the right lower lobe with possibility of malignant neoplastic process is not excluded recommended for follow-up PET/CT and tissue sampling is warranted and should be considered.  He was also found to have a moderate-sized left pleural effusion some of which is loculated along the lateral aspect of the left lower lung field, severe COPD, extensive coronary disease previous coronary bypass surgery, severe atherosclerotic changes are noted in the abdominal aorta and its major branches, 1.4 cm left renal calculus.  Medications given in the emergency department: Albuterol nebulizer, aspirin 81 mg, atorvastatin 40 mg, Lasix 40  mg, Prozac 20 mg, trazodone 25 mg, tamsulosin 0.8 mg, senna, and 1 tablet, Dulera inhaler, hydrocodone-acetaminophen 5-'3 25 1 '$ tablet  Past Medical History:  Diagnosis Date   Arthritis     Cancer ([place])    CHF (congestive heart failure) (H[facility])    COPD (chronic obstructive pulmonary disease) ([place])    Heart attack ([place])    Hypercholesteremia    Hypertension    Stroke [facility])     Past Surgical History:  Procedure Laterality Date   BACK SURGERY     CARDIAC SURGERY     CORONARY ARTERY BYPASS GRAFT     VASCULAR SURGERY       Home Medications:  Prior to Admission medications   Medication Sig Start Date End Date Taking? Authorizing Provider  acetaminophen (TYLENOL) 500 MG tablet Take 500-1,000 mg by mouth every 6 (six) hours as needed for mild pain or moderate pain.   Yes [provider]  albuterol (PROVENTIL) (2.5 MG/3ML) 0.083% nebulizer solution Take 2.5 mg by nebulization 4 (four) times daily.   Yes [provider]  albuterol (VENTOLIN HFA) 108 (90 Base) MCG/ACT inhaler Inhale 1-2 puffs into the lungs every 6 (six) hours as needed for wheezing or shortness of breath.   Yes [provider]  aspirin 81 MG EC tablet Take 81 mg by mouth daily.   Yes [provider]  atorvastatin (LIPITOR) 40 MG tablet Take 40 mg by mouth daily.   Yes [provider]  FLUoxetine (PROZAC) 20 MG capsule Take 20 mg by mouth daily.   Yes [provider]  fluticasone-salmeterol (ADVAIR) 250-50 MCG/ACT AEPB Inhale 1 puff into the lungs in the morning and at bedtime.   Yes [provider]  furosemide (LASIX) 20 MG tablet Take 40 mg by mouth 2 (two) times daily.   Yes [provider]  ipratropium (ATROVENT) 0.02 % nebulizer solution Inhale 0.5 mg into the lungs 3 (three) times daily.   Yes [provider]  senna-docusate (SENOKOT-S) 8.6-50 MG tablet Take 1 tablet by mouth daily.   Yes [provider]  tamsulosin (FLOMAX) 0.4 MG CAPS capsule Take 0.8 mg by mouth daily.   Yes [provider]  traZODone (DESYREL) 50 MG tablet Take 25 mg by mouth at bedtime.   Yes [provider]    Inpatient  Medications: Scheduled Meds:  aspirin EC  81 mg Oral Daily   atorvastatin  40 mg Oral Daily   enoxaparin (LOVENOX) injection  40 mg Subcutaneous Q24H   FLUoxetine  20 mg Oral Daily   furosemide  40 mg Oral BID   mometasone-formoterol  2 puff Inhalation BID   senna-docusate  1 tablet Oral Daily   tamsulosin  0.8 mg Oral Daily   traZODone  25 mg Oral QHS   Continuous Infusions:  PRN Meds: acetaminophen **OR** acetaminophen, albuterol, HYDROcodone-acetaminophen, ipratropium-albuterol, morphine injection, ondansetron **OR** ondansetron (ZOFRAN) IV, mouth rinse  Allergies:   No Known Allergies  Social History:   Social History   Socioeconomic History   Marital status: Married    Spouse name: Not on file   Number of children: Not on file   Years of education: Not on file   Highest education level: Not on file  Occupational History   Not on file  Tobacco Use   Smoking status: Former    Types: Cigarettes   Smokeless tobacco: Never  Substance and Sexual Activity   Alcohol use: Not Currently   Drug use: Never   Sexual activity:  Not on file  Other Topics Concern   Not on file  Social History Narrative   Not on file   Social Determinants of Health   Financial Resource Strain: Not on file  Food Insecurity: No Food Insecurity (06/21/2022)   Hunger Vital Sign    Worried About Running Out of Food in the Last Year: Never true    Ran Out of Food in the Last Year: Never true  Transportation Needs: No Transportation Needs (06/20/2022)   PRAPARE - Hydrologist (Medical): No    Lack of Transportation (Non-Medical): No  Physical Activity: Not on file  Stress: Not on file  Social Connections: Not on file  Intimate Partner Violence: Not At Risk (06/20/2022)   Humiliation, Afraid, Rape, and Kick questionnaire    Fear of Current or Ex-Partner: No    Emotionally Abused: No    Physically Abused: No    Sexually Abused: No    Family History:   History reviewed.  No pertinent family history.   ROS:  Please see the history of present illness.  Review of Systems  Constitutional:  Positive for malaise/fatigue.  Respiratory:  Positive for cough and shortness of breath.   Cardiovascular:  Positive for orthopnea and leg swelling.  Neurological:  Positive for weakness.    All other ROS reviewed and negative.     Physical Exam/Data:   Vitals:   06/21/22 0850 06/21/22 0852 06/21/22 1014 06/21/22 1037  BP:  130/63 (!) 144/59 (!) 125/56  Pulse: 73 75 83 95  Resp:  '16 16 16  '$ Temp: 97.9 F (36.6 C)     TempSrc: Oral     SpO2: 100%  96% 93%  Weight:      Height:        Intake/Output Summary (Last 24 hours) at 06/21/2022 1254 Last data filed at 06/21/2022 1006 Gross per 24 hour  Intake 360 ml  Output 950 ml  Net -590 ml      06/21/2022    4:41 AM 06/20/2022   11:21 AM 05/22/2022   11:48 AM  Last 3 Weights  Weight (lbs) 179 lb 14.3 oz 179 lb 14.3 oz 180 lb  Weight (kg) 81.6 kg 81.6 kg 81.647 kg     Body mass index is 24.4 kg/m.  General:  Well nourished, well developed, in no acute distress, visually fatigued HEENT: normal Neck: no JVD Vascular: No carotid bruits; Distal pulses 2+ bilaterally Cardiac:  normal S1, S2; RRR; no murmur  Lungs: Diminished breath sounds throughout left-sided crackles to auscultation bilaterally, respirations are unlabored at rest on 2 L of O2 via nasal cannula Abd: soft, nontender, no hepatomegaly  Ext: Trace edema Musculoskeletal:  No deformities, BUE and BLE strength normal and equal Skin: warm and dry  Neuro:  CNs 2-12 intact, no focal abnormalities noted Psych:  Normal affect   EKG:  The EKG was personally reviewed and demonstrates: Sinus rhythm with a rate of 79 with first-degree AV block and left axis deviation Telemetry:  Telemetry was personally reviewed and demonstrates: Sinus rhythm rate in the 70s with first-degree AV block  Relevant CV Studies: Echocardiogram ordered and pending  Laboratory  Data:  High Sensitivity Troponin:   Recent Labs  Lab 06/20/22 1136 06/20/22 1538  TROPONINIHS 33* 35*     Chemistry Recent Labs  Lab 06/20/22 1136 06/21/22 1145  NA 141 140  K 3.6 3.8  CL 103 102  CO2 30 30  GLUCOSE 135* 172*  BUN  36* 35*  CREATININE 1.99* 2.03*  CALCIUM 9.2 8.9  MG 2.5*  --   GFRNONAA 33* 32*  ANIONGAP 8 8    Recent Labs  Lab 06/20/22 1136  PROT 7.4  ALBUMIN 4.3  AST 17  ALT 14  ALKPHOS 67  BILITOT 0.7   Lipids No results for input(s): "CHOL", "TRIG", "HDL", "LABVLDL", "LDLCALC", "CHOLHDL" in the last 168 hours.  Hematology Recent Labs  Lab 06/20/22 1136 06/21/22 1145  WBC 5.6 5.8  RBC 2.97* 2.66*  HGB 8.3* 7.6*  HCT 28.2* 25.1*  MCV 94.9 94.4  MCH 27.9 28.6  MCHC 29.4* 30.3  RDW 17.0* 17.1*  PLT 200 186   Thyroid No results for input(s): "TSH", "FREET4" in the last 168 hours.  BNP Recent Labs  Lab 06/20/22 1136  BNP 409.7*    DDimer No results for input(s): "DDIMER" in the last 168 hours.   Radiology/Studies:  DG Chest Port 1 View  Result Date: 06/21/2022 CLINICAL DATA:  Status post thoracentesis on the left. EXAM: PORTABLE CHEST 1 VIEW COMPARISON:  CT and radiographs 06/20/2022. FINDINGS: 1039 hours. Interval near complete evacuation of the previously demonstrated left-sided pleural effusion. There is a small left-sided pneumothorax with apical and basilar components, partially ex vacuo. No resulting mediastinal shift. There is improved aeration of the left lung with mild residual left basilar atelectasis. The right lung is clear. The heart size and mediastinal contours are stable post median sternotomy. Previous cervical fusion noted. IMPRESSION: Small left-sided pneumothorax post thoracentesis, predominately ex vacuo. No tension component identified. Critical Value/emergent results were communicated at the time of interpretation on 06/21/2022 at 11:01 am to provider [facility] , who acknowledged these results. Electronically  Signed   By: Richardean Sale M.D.   On: 06/21/2022 11:02   CT Chest Wo Contrast  Result Date: 06/20/2022 CLINICAL DATA:  Cough, shortness of breath x2 weeks EXAM: CT CHEST WITHOUT CONTRAST TECHNIQUE: Multidetector CT imaging of the chest was performed following the standard protocol without IV contrast. RADIATION DOSE REDUCTION: This exam was performed according to the departmental dose-optimization program which includes automated exposure control, adjustment of the mA and/or kV according to patient size and/or use of iterative reconstruction technique. COMPARISON:  None Available. FINDINGS: Cardiovascular: Extensive coronary artery calcifications are seen. There is previous coronary artery bypass. Heart is enlarged in size. Mediastinum/Nodes: No significant lymphadenopathy is seen. Lungs/Pleura: Centrilobular and panlobular emphysema is seen. In image 65 of series 5, there is 1.5 x 1.3 cm nodule with spiculated margins in the anterolateral aspect of right upper lobe. There is moderate to large left pleural effusion part of which appears to be loculated in the lateral aspect of left lower lung field. There is no significant right pleural effusion. Linear infiltrates are seen in lingula. In image 112 of series 3, there is 2.8 x 1.7 cm pleural-based nodular infiltrate in left lower lobe. In image 109, there is 8 mm pleural-based nodule in right lower lobe. Fibrotic changes are noted in right middle lobe, anterior left upper lobe and anterior right upper lobe. There is no pneumothorax. Upper Abdomen: There is previous endovascular stent repair in abdominal aorta. A stent is noted in proximal courses of celiac and SMA. There is 1.4 cm calculus in the upper pole of left kidney. Musculoskeletal: Decrease in height of lower endplate of body of L2 vertebra has not changed. There is mild decrease in height of upper endplate of body of L1 vertebra with no significant change. IMPRESSION: There is 2.8  cm pleural-based  nodular density in left lower lobe. This may suggest atelectasis/pneumonia and possibly neoplastic process. There is a 1.5 cm nodule with spiculated margins in right upper lobe. There is 8 mm pleural-based nodule in right lower lobe. Possibility of malignant neoplastic process is not excluded. Follow-up PET-CT and tissue sampling as warranted should be considered. Moderate sized left pleural effusion some of which is loculated along the lateral aspect of left lower lung field. This may suggest parapneumonic effusion or pleural metastatic disease. Severe COPD.  No significant lymphadenopathy is seen in mediastinum. Extensive coronary artery disease with previous coronary bypass surgery. Severe atherosclerotic changes are noted in abdominal aorta and its major branches. There is a 1.4 cm left renal calculus. Other findings as described in the body of the report. Electronically Signed   By: Elmer Picker M.D.   On: 06/20/2022 17:36   DG Chest 2 View  Result Date: 06/20/2022 CLINICAL DATA:  Shortness of breath EXAM: CHEST - 2 VIEW COMPARISON:  None Available. FINDINGS: Cardiomegaly status post median sternotomy and CABG. Moderate left pleural effusion associated atelectasis or consolidation. Diffuse bilateral interstitial opacity. Underlying emphysema. Disc degenerative disease of the thoracic spine. IMPRESSION: 1. Moderate left pleural effusion and associated atelectasis or consolidation. 2. Diffuse bilateral interstitial opacity, consistent with edema and/or infection. No focal airspace opacity. 3. Underlying emphysema. Electronically Signed   By: Delanna Ahmadi M.D.   On: 06/20/2022 12:15     Assessment and Plan:   Acute on chronic diastolic congestive heart failure -LVEF greater than 55% 09/01/2020 at [facility] -Echocardiogram ordered and pending -BNP 409.7 -If continued symptoms of shortness of breath consider changing oral Lasix to IV if renal function allows - -961ms output overnight -Daily weight,  I&O, low-sodium diet  Left pleural effusion -Monitor left pleural effusion noted on chest x-ray and CT of the chest -Interventional radiology was consulted on 5 medicine team and patient underwent successful thoracentesis today with removal of 1.7 L of amber-colored fluid, patient tolerated the procedure well with no immediate complications -Specimen was sent for cytology  CAD status post CABG with elevated high-sensitivity troponin -Chest pain-free -No ischemic changes noted on EKG or telemetry monitoring -High-sensitivity troponins trended at 33 and 35-likely demand ischemia, not consistent with ACS -Continue aspirin 81 mg daily and Lipitor 40 mg daily  Acute on chronic dyspnea with COPD and chronic oxygen use -Multifactorial likely related to diastolic congestive heart failure exacerbation, pleural effusion, possible pneumonia -Continue to support oxygenation with chronic O2 therapy titrating to keep O2 sats greater than equal to 92% -CTA of the chest lower suspicion for PE -continue supportive care  Multiple pulmonary nodules -Patient has longstanding history of multiple stable pulmonary nodules -Radiology and recommendation for follow-up with a PET/CT and tissue sampling -Management per IM  6.     AAA with endovascular repair; s/p celiac artery/stenting, common iliac artery aneurysm, PAD -stable -continue asa and statin therapy  7.     CKD stage IIIb -serum creatinine 2.03 -baseline creatinine 1.85 -monitor urine output -monitor/trend/replete electrolytes as needed -daily bmp -avoid nephrotoxic agents as able   Risk Assessment/Risk Scores:        New York Heart Association (NYHA) Functional Class NYHA Class III        For questions or updates, please contact CCedar ParkPlease consult [email or web address] for contact info under    Signed, Tillmon Kisling, NP  06/21/2022 12:54 PM

## 2022-06-21 NOTE — Plan of Care (Signed)
  Problem: Education: Goal: Ability to demonstrate management of disease process will improve Outcome: Progressing Goal: Ability to verbalize understanding of medication therapies will improve Outcome: Progressing Goal: Individualized Educational Video(s) Outcome: Progressing   Problem: Cardiac: Goal: Ability to achieve and maintain adequate cardiopulmonary perfusion will improve Outcome: Progressing   

## 2022-06-21 NOTE — Progress Notes (Signed)
PROGRESS NOTE    Walter Moody  BOF:[id number] DOB: 11/02/1940 DOA: 06/20/2022 PCP: Walter Barnacle, MD    Brief Narrative:  81 y.o. 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  He has been coughing but denies fever or chills.  Denies chest pain. ED course and data review: Vitals within normal limits except for mild tachypnea of 22-25.  Labs with troponin 35 and BNP 4 7, WBC 5.6 with lactic acid 0.8 and procalcitonin pending.  Hemoglobin 8.3 which is his baseline and creatinine 1.99 which is at baseline  Status post ultrasound-guided thoracentesis on 9/6.  1.7 L fluid liberated.  Sent to lab for fluid studies and cytology.  Small ex vacuo pneumothorax identified.  Patient relatively asymptomatic.  Assessment & Plan:   Principal Problem:   Acute on chronic diastolic CHF (congestive heart failure) (HCC) Active Problems:   Pleural effusion on left   Acute on chronic dyspnea   Multiple pulmonary nodules   CAD with hx of CABG   COPD (chronic obstructive pulmonary disease) with emphysema (HCC)   History of AAA (abdominal aortic aneurysm) repair   Stage 3b chronic kidney disease (HCC)   Chronic respiratory failure with hypoxia (HCC)   History of stroke   Elevated troponin   Dementia without behavioral disturbance (HCC)   Chronic back pain   Respiratory failure with  hypoxia (HCC)  * Acute on chronic diastolic CHF (congestive heart failure) ([place]) Patient presents with shortness of breath, dyspnea on exertion lower extremity edema, pleural effusion, elevated troponin of 35 which could be demand ischemia as well as BNP elevated at 409 Difficult to assess volume status Plan: Continue p.o. Lasix 40 mg twice daily Check 2D echocardiogram Cardiology consulted on admission   Pleural effusion on left IR consulted for thoracentesis to help determine etiology of pleural effusion CT chest showing moderate-sized left pleural effusion some of which is loculated suggesting parapneumonic effusion or pleural metastatic disease -Status post ultrasound-guided thoracentesis 9/6.  1.7 L fluid liberated Plan: Follow fluid studies   Acute on chronic dyspnea Multifactorial related to pleural effusion, CHF with exacerbation, possible malignancy based on CT findings with chronic physical deconditioning.  Lower suspicion for infectious process and lower suspicion for PE O2 requirement is at baseline so continue home O2 Treat primary suspected etiologies of CHF and pleural effusion of undetermined etiology as outlined in note   CAD with hx of CABG Elevated troponin Troponin of 35 but without complaints of chest pain and with nonacute EKG Elevated troponin likely related to demand ischemia from ongoing dyspnea Continue aspirin, atorvastatin   Multiple pulmonary nodules Patient has history of multiple stable pulmonary nodules per review of PCPs note. Current CT was not compared to prior stating none was available Current CT showing multiple nodules somewhat spiculated margins Suggest reread with comparison to prior, otherwise recommendation for follow-up PET CT and tissue sampling is recommended by radiologist Follow-up  cytology from thoracentesis   Chronic back pain Physical deconditioning and ambulatory dysfunction At baseline ambulates with a walker Hydrocodone for  pain PT/OT TOC consults   Dementia without behavioral disturbance ([place]) Delirium precautions --Get up during the day --Keep blinds open and lights on during daylight hours --Minimize the use of opioids/benzodiazepines --Reorient the patient frequently, provide easily visible clock and calendar --Provide sensory aids like glasses, hearing aids --Encourage ambulation, regular activities and visitors to maintain cognitive stimulation  --Patient would benefit from having family members at bedside to reinforce his orientation.       History of stroke Continue aspirin and statin   Stage 3b chronic kidney disease ([place]) Renal function at baseline   History of AAA (abdominal aortic aneurysm) repair S/p celiac artery stenting Common iliac artery aneurysm PAD Continue aspirin and statin   COPD (chronic obstructive pulmonary disease) with emphysema (HCC) Chronic respiratory failure with hypoxia Not acutely exacerbated Continue home inhaler DuoNebs as needed Patient's O2 requirement is at baseline Continue supplemental oxygen   DVT prophylaxis: SQ Lovenox Code Status: DNR Family Communication: Daughter Walter Moody [phone] Disposition Plan: Status is: Inpatient Remains inpatient appropriate because: Multifactorial shortness of breath.  Symptomatic pleural effusion.  Decompensated heart failure.   Level of care: Med-Surg  Consultants:  Cardiology-[facility]  Procedures:  Ultrasound-guided thoracentesis 9/6  Antimicrobials: None   Subjective: Patient seen and examined peer resting comfortably in bed.  Endorses shortness of breath.  Otherwise no pain complaints.  Objective: Vitals:   06/21/22 0850 06/21/22 0852 06/21/22 1014 06/21/22 1037  BP:  130/63 (!) 144/59 (!) 125/56  Pulse: 73 75 83 95  Resp:  '16 16 16  '$ Temp: 97.9 F (36.6 C)     TempSrc: Oral     SpO2: 100%  96% 93%  Weight:      Height:        Intake/Output Summary (Last 24 hours) at 06/21/2022  1351 Last data filed at 06/21/2022 1006 Gross per 24 hour  Intake 360 ml  Output 950 ml  Net -590 ml   Filed Weights   06/20/22 1121 06/21/22 0441  Weight: 81.6 kg 81.6 kg    Examination:  General exam: NAD.  Appears fatigued Respiratory system: Left-sided crackles, decreased breath sounds.  Normal work of breathing.  2 L Cardiovascular system: S1-S2, RRR, no murmurs, no pedal edema Gastrointestinal system: Soft, NT/ND, normal bowel sounds Central nervous system: Alert, oriented x2, no focal deficits Extremities: Symmetric 5 x 5 power. Skin: No rashes, lesions or ulcers Psychiatry: Judgement and insight appear normal. Mood & affect appropriate.     Data Reviewed: I have personally reviewed following labs and imaging studies  CBC: Recent Labs  Lab 06/20/22 1136 06/21/22 1145  WBC 5.6 5.8  NEUTROABS 4.1 4.2  HGB 8.3* 7.6*  HCT 28.2* 25.1*  MCV 94.9 94.4  PLT 200 324   Basic Metabolic Panel: Recent Labs  Lab 06/20/22 1136 06/21/22 1145  NA 141 140  K 3.6 3.8  CL 103 102  CO2 30 30  GLUCOSE 135* 172*  BUN 36* 35*  CREATININE 1.99* 2.03*  CALCIUM 9.2 8.9  MG 2.5*  --    GFR: Estimated Creatinine Clearance: 31.3 mL/min (A) (by C-G formula based on SCr of 2.03 mg/dL (H)). Liver Function Tests: Recent Labs  Lab 06/20/22 1136  AST 17  ALT 14  ALKPHOS 67  BILITOT 0.7  PROT 7.4  ALBUMIN 4.3   No results for input(s): "LIPASE", "AMYLASE" in the last 168 hours.  No results for input(s): "AMMONIA" in the last 168 hours. Coagulation Profile: No results for input(s): "INR", "PROTIME" in the last 168 hours. Cardiac Enzymes: No results for input(s): "CKTOTAL", "CKMB", "CKMBINDEX", "TROPONINI" in the last 168 hours. BNP (last 3 results) No results for input(s): "PROBNP" in the last 8760 hours. HbA1C: No results for input(s): "HGBA1C" in the last 72 hours. CBG: No results for input(s): "GLUCAP" in the last 168 hours. Lipid Profile: No results for input(s):  "CHOL", "HDL", "LDLCALC", "TRIG", "CHOLHDL", "LDLDIRECT" in the last 72 hours. Thyroid Function Tests: No results for input(s): "TSH", "T4TOTAL", "FREET4", "T3FREE", "THYROIDAB" in the last 72 hours. Anemia Panel: No results for input(s): "VITAMINB12", "FOLATE", "FERRITIN", "TIBC", "IRON", "RETICCTPCT" in the last 72 hours. Sepsis Labs: Recent Labs  Lab 06/20/22 1538 06/20/22 2017  PROCALCITON <0.10  --   LATICACIDVEN  --  0.8    Recent Results (from the past 240 hour(s))  Resp Panel by RT-PCR (Flu A&B, Covid) Anterior Nasal Swab     Status: None   Collection Time: 06/20/22  4:26 PM   Specimen: Anterior Nasal Swab  Result Value Ref Range Status   SARS Coronavirus 2 by RT PCR NEGATIVE NEGATIVE Final    Comment: (NOTE) SARS-CoV-2 target nucleic acids are NOT DETECTED.  The SARS-CoV-2 RNA is generally detectable in upper respiratory specimens during the acute phase of infection. The lowest concentration of SARS-CoV-2 viral copies this assay can detect is 138 copies/mL. A negative result does not preclude SARS-Cov-2 infection and should not be used as the sole basis for treatment or other patient management decisions. A negative result may occur with  improper specimen collection/handling, submission of specimen other than nasopharyngeal swab, presence of viral mutation(s) within the areas targeted by this assay, and inadequate number of viral copies(<138 copies/mL). A negative result must be combined with clinical observations, patient history, and epidemiological information. The expected result is Negative.  Fact Sheet for Patients:  EntrepreneurPulse.com.au  Fact Sheet for Healthcare Providers:  IncredibleEmployment.be  This test is no t yet approved or cleared by the [place] FDA and  has been authorized for detection and/or diagnosis of SARS-CoV-2 by FDA under an Emergency Use Authorization (EUA). This EUA will remain  in effect  (meaning this test can be used) for the duration of the COVID-19 declaration under Section 564(b)(1) of the Act, 21 U.S.C.section 360bbb-3(b)(1), unless the authorization is terminated  or revoked sooner.       Influenza A by PCR NEGATIVE NEGATIVE Final   Influenza B by PCR NEGATIVE NEGATIVE Final    Comment: (NOTE) The Xpert Xpress SARS-CoV-2/FLU/RSV plus assay is intended as an aid in the diagnosis of influenza from Nasopharyngeal swab specimens and should not be used as a sole basis for treatment. Nasal washings and aspirates are unacceptable for Xpert Xpress SARS-CoV-2/FLU/RSV testing.  Fact Sheet for Patients: EntrepreneurPulse.com.au  Fact Sheet for Healthcare Providers: IncredibleEmployment.be  This test is not yet approved or cleared by the [place] FDA and has been authorized for detection and/or diagnosis of SARS-CoV-2 by FDA under an Emergency Use Authorization (EUA). This EUA will remain in effect (meaning this test can be used) for the duration of the COVID-19 declaration under Section 564(b)(1) of the Act, 21 U.S.C. section 360bbb-3(b)(1), unless the authorization is terminated or revoked.  Performed at [facility], [address] [place], [place] [address]   Blood culture (routine x 2)     Status: None (Preliminary result)   Collection Time: 06/20/22  8:17  PM   Specimen: BLOOD  Result Value Ref Range Status   Specimen Description BLOOD BLOOD LEFT FOREARM  Final   Special Requests   Final    BOTTLES DRAWN AEROBIC AND ANAEROBIC Blood Culture results may not be optimal due to an excessive volume of blood received in culture bottles   Culture   Final    NO GROWTH < 12 HOURS Performed at [facility], [address], [place], [place] [address]    Report Status PENDING  Incomplete  Blood culture (routine x 2)     Status: None (Preliminary result)   Collection Time: 06/20/22  8:17 PM   Specimen: BLOOD   Result Value Ref Range Status   Specimen Description BLOOD BLOOD LEFT ARM  Final   Special Requests   Final    BOTTLES DRAWN AEROBIC AND ANAEROBIC Blood Culture results may not be optimal due to an excessive volume of blood received in culture bottles   Culture   Final    NO GROWTH < 12 HOURS Performed at [facility], [address] [place], [place] [address]    Report Status PENDING  Incomplete         Radiology Studies: DG Chest Port 1 View  Result Date: 06/21/2022 CLINICAL DATA:  Status post thoracentesis on the left. EXAM: PORTABLE CHEST 1 VIEW COMPARISON:  CT and radiographs 06/20/2022. FINDINGS: 1039 hours. Interval near complete evacuation of the previously demonstrated left-sided pleural effusion. There is a small left-sided pneumothorax with apical and basilar components, partially ex vacuo. No resulting mediastinal shift. There is improved aeration of the left lung with mild residual left basilar atelectasis. The right lung is clear. The heart size and mediastinal contours are stable post median sternotomy. Previous cervical fusion noted. IMPRESSION: Small left-sided pneumothorax post thoracentesis, predominately ex vacuo. No tension component identified. Critical Value/emergent results were communicated at the time of interpretation on 06/21/2022 at 11:01 am to provider [facility] , who acknowledged these results. Electronically Signed   By: Richardean Sale M.D.   On: 06/21/2022 11:02   CT Chest Wo Contrast  Result Date: 06/20/2022 CLINICAL DATA:  Cough, shortness of breath x2 weeks EXAM: CT CHEST WITHOUT CONTRAST TECHNIQUE: Multidetector CT imaging of the chest was performed following the standard protocol without IV contrast. RADIATION DOSE REDUCTION: This exam was performed according to the departmental dose-optimization program which includes automated exposure control, adjustment of the mA and/or kV according to patient size and/or use of iterative reconstruction  technique. COMPARISON:  None Available. FINDINGS: Cardiovascular: Extensive coronary artery calcifications are seen. There is previous coronary artery bypass. Heart is enlarged in size. Mediastinum/Nodes: No significant lymphadenopathy is seen. Lungs/Pleura: Centrilobular and panlobular emphysema is seen. In image 65 of series 5, there is 1.5 x 1.3 cm nodule with spiculated margins in the anterolateral aspect of right upper lobe. There is moderate to large left pleural effusion part of which appears to be loculated in the lateral aspect of left lower lung field. There is no significant right pleural effusion. Linear infiltrates are seen in lingula. In image 112 of series 3, there is 2.8 x 1.7 cm pleural-based nodular infiltrate in left lower lobe. In image 109, there is 8 mm pleural-based nodule in right lower lobe. Fibrotic changes are noted in right middle lobe, anterior left upper lobe and anterior right upper lobe. There is no pneumothorax. Upper Abdomen: There is previous endovascular stent repair in abdominal aorta. A stent is noted in proximal courses of celiac and  SMA. There is 1.4 cm calculus in the upper pole of left kidney. Musculoskeletal: Decrease in height of lower endplate of body of L2 vertebra has not changed. There is mild decrease in height of upper endplate of body of L1 vertebra with no significant change. IMPRESSION: There is 2.8 cm pleural-based nodular density in left lower lobe. This may suggest atelectasis/pneumonia and possibly neoplastic process. There is a 1.5 cm nodule with spiculated margins in right upper lobe. There is 8 mm pleural-based nodule in right lower lobe. Possibility of malignant neoplastic process is not excluded. Follow-up PET-CT and tissue sampling as warranted should be considered. Moderate sized left pleural effusion some of which is loculated along the lateral aspect of left lower lung field. This may suggest parapneumonic effusion or pleural metastatic disease.  Severe COPD.  No significant lymphadenopathy is seen in mediastinum. Extensive coronary artery disease with previous coronary bypass surgery. Severe atherosclerotic changes are noted in abdominal aorta and its major branches. There is a 1.4 cm left renal calculus. Other findings as described in the body of the report. Electronically Signed   By: Elmer Picker M.D.   On: 06/20/2022 17:36   DG Chest 2 View  Result Date: 06/20/2022 CLINICAL DATA:  Shortness of breath EXAM: CHEST - 2 VIEW COMPARISON:  None Available. FINDINGS: Cardiomegaly status post median sternotomy and CABG. Moderate left pleural effusion associated atelectasis or consolidation. Diffuse bilateral interstitial opacity. Underlying emphysema. Disc degenerative disease of the thoracic spine. IMPRESSION: 1. Moderate left pleural effusion and associated atelectasis or consolidation. 2. Diffuse bilateral interstitial opacity, consistent with edema and/or infection. No focal airspace opacity. 3. Underlying emphysema. Electronically Signed   By: Delanna Ahmadi M.D.   On: 06/20/2022 12:15        Scheduled Meds:  aspirin EC  81 mg Oral Daily   atorvastatin  40 mg Oral Daily   enoxaparin (LOVENOX) injection  40 mg Subcutaneous Q24H   FLUoxetine  20 mg Oral Daily   furosemide  40 mg Oral BID   mometasone-formoterol  2 puff Inhalation BID   senna-docusate  1 tablet Oral Daily   tamsulosin  0.8 mg Oral Daily   traZODone  25 mg Oral QHS   Continuous Infusions:   LOS: 0 days     Sidney Ace, MD [facility]   If 7PM-7AM, please contact night-coverage  06/21/2022, 1:51 PM

## 2022-06-21 NOTE — Discharge Instructions (Signed)

## 2022-06-21 NOTE — Progress Notes (Signed)
*  PRELIMINARY RESULTS* Echocardiogram 2D Echocardiogram has been performed.  [name] 06/21/2022, 2:38 PM

## 2022-06-21 NOTE — Procedures (Addendum)
PROCEDURE SUMMARY:  Successful US guided left thoracentesis. Yielded 1.7 L of amber-colored fluid. Pt tolerated procedure well. No immediate complications.  Specimen sent for labs. CXR ordered; small ex-vacuo pneumothorax identified. Will repeat chest x-ray in 2-3 hours.   EBL < 2 mL  ** Repeat CXR shows the ex-vacuo pneumothorax to be stable/possibly slightly improved. No further follow up needed.  Theresa Duty, NP 06/21/2022 11:45 AM

## 2022-06-21 NOTE — Progress Notes (Signed)
Mobility Specialist - Progress Note   06/21/22 1254  Mobility  Activity Refused mobility  $Mobility charge 1 Mobility   Pt declined mobility, no reason specified. Pt had no complaints. Will attempt at another date and time.   Gretchen Short  Mobility Specialist  06/21/22 12:55 PM

## 2022-06-22 ENCOUNTER — Inpatient Hospital Stay: Payer: Medicare Other

## 2022-06-22 DIAGNOSIS — N1832 Chronic kidney disease, stage 3b: Secondary | ICD-10-CM

## 2022-06-22 DIAGNOSIS — Z9889 Other specified postprocedural states: Secondary | ICD-10-CM

## 2022-06-22 DIAGNOSIS — J9611 Chronic respiratory failure with hypoxia: Secondary | ICD-10-CM

## 2022-06-22 DIAGNOSIS — I5033 Acute on chronic diastolic (congestive) heart failure: Secondary | ICD-10-CM | POA: Diagnosis not present

## 2022-06-22 DIAGNOSIS — R778 Other specified abnormalities of plasma proteins: Secondary | ICD-10-CM

## 2022-06-22 DIAGNOSIS — J432 Centrilobular emphysema: Secondary | ICD-10-CM

## 2022-06-22 LAB — BLOOD CULTURE ID PANEL (REFLEXED) - BCID2

## 2022-06-22 LAB — BASIC METABOLIC PANEL
Anion gap: 10 (ref 5–15)
BUN: 35 mg/dL — ABNORMAL HIGH (ref 8–23)
CO2: 29 mmol/L (ref 22–32)
Calcium: 8.8 mg/dL — ABNORMAL LOW (ref 8.9–10.3)
Chloride: 100 mmol/L (ref 98–111)
Creatinine, Ser: 2.01 mg/dL — ABNORMAL HIGH (ref 0.61–1.24)
GFR, Estimated: 33 mL/min — ABNORMAL LOW (ref >60)
Glucose, Bld: 111 mg/dL — ABNORMAL HIGH (ref 70–99)
Potassium: 3.8 mmol/L (ref 3.5–5.1)
Sodium: 139 mmol/L (ref 135–145)

## 2022-06-22 LAB — CBC WITH DIFFERENTIAL/PLATELET
Abs Immature Granulocytes: 0.08 10*3/uL — ABNORMAL HIGH (ref 0.00–0.07)
Basophils Absolute: 0 10*3/uL (ref 0.0–0.1)
Basophils Relative: 0 %
Eosinophils Absolute: 0.2 10*3/uL (ref 0.0–0.5)
Eosinophils Relative: 3 %
HCT: 27.6 % — ABNORMAL LOW (ref 39.0–52.0)
Hemoglobin: 8.5 g/dL — ABNORMAL LOW (ref 13.0–17.0)
Immature Granulocytes: 1 %
Lymphocytes Relative: 13 %
Lymphs Abs: 0.8 10*3/uL (ref 0.7–4.0)
MCH: 28.3 pg (ref 26.0–34.0)
MCHC: 30.8 g/dL (ref 30.0–36.0)
MCV: 92 fL (ref 80.0–100.0)
Monocytes Absolute: 0.8 10*3/uL (ref 0.1–1.0)
Monocytes Relative: 12 %
Neutro Abs: 4.8 10*3/uL (ref 1.7–7.7)
Neutrophils Relative %: 71 %
Platelets: 195 10*3/uL (ref 150–400)
RBC: 3 MIL/uL — ABNORMAL LOW (ref 4.22–5.81)
RDW: 17.1 % — ABNORMAL HIGH (ref 11.5–15.5)
WBC: 6.7 10*3/uL (ref 4.0–10.5)
nRBC: 0 % (ref 0.0–0.2)

## 2022-06-22 LAB — ACID FAST SMEAR (AFB, MYCOBACTERIA): Acid Fast Smear: NEGATIVE

## 2022-06-22 LAB — MAGNESIUM: Magnesium: 2.4 mg/dL (ref 1.7–2.4)

## 2022-06-22 LAB — CYTOLOGY - NON PAP

## 2022-06-22 MED ORDER — POTASSIUM CHLORIDE CRYS ER 20 MEQ PO TBCR
40.0000 meq | EXTENDED_RELEASE_TABLET | Freq: Once | ORAL | Status: AC
Start: 2022-06-22 — End: 2022-06-22
  Administered 2022-06-22: 40 meq via ORAL
  Filled 2022-06-22: qty 2

## 2022-06-22 NOTE — Progress Notes (Signed)
PHARMACY - PHYSICIAN COMMUNICATION CRITICAL VALUE ALERT - BLOOD CULTURE IDENTIFICATION (BCID)  Walter Moody is an 81 y.o. male who presented to [facility] on 06/20/2022 with a chief complaint of shortness of breath  Assessment:  Blood culture from 9/5 with GPC in 1/4 bottles, BCID detects Staphylococcus species (Not S aureus or epidermidis).    Name of physician (or Provider) Contacted: Dr Priscella Mann  Current antibiotics: none  Changes to prescribed antibiotics recommended:  Monitor off antibiotics as likely contaminant  Results for orders placed or performed during the hospital encounter of 06/20/22  Blood Culture ID Panel (Reflexed) (Collected: 06/20/2022  8:17 PM)  Result Value Ref Range   Enterococcus faecalis NOT DETECTED NOT DETECTED   Enterococcus Faecium NOT DETECTED NOT DETECTED   Listeria monocytogenes NOT DETECTED NOT DETECTED   Staphylococcus species DETECTED (A) NOT DETECTED   Staphylococcus aureus (BCID) NOT DETECTED NOT DETECTED   Staphylococcus epidermidis NOT DETECTED NOT DETECTED   Staphylococcus lugdunensis NOT DETECTED NOT DETECTED   Streptococcus species NOT DETECTED NOT DETECTED   Streptococcus agalactiae NOT DETECTED NOT DETECTED   Streptococcus pneumoniae NOT DETECTED NOT DETECTED   Streptococcus pyogenes NOT DETECTED NOT DETECTED   A.calcoaceticus-baumannii NOT DETECTED NOT DETECTED   Bacteroides fragilis NOT DETECTED NOT DETECTED   Enterobacterales NOT DETECTED NOT DETECTED   Enterobacter cloacae complex NOT DETECTED NOT DETECTED   Escherichia coli NOT DETECTED NOT DETECTED   Klebsiella aerogenes NOT DETECTED NOT DETECTED   Klebsiella oxytoca NOT DETECTED NOT DETECTED   Klebsiella pneumoniae NOT DETECTED NOT DETECTED   Proteus species NOT DETECTED NOT DETECTED   Salmonella species NOT DETECTED NOT DETECTED   Serratia marcescens NOT DETECTED NOT DETECTED   Haemophilus influenzae NOT DETECTED NOT DETECTED   Neisseria meningitidis NOT DETECTED NOT  DETECTED   Pseudomonas aeruginosa NOT DETECTED NOT DETECTED   Stenotrophomonas maltophilia NOT DETECTED NOT DETECTED   Candida albicans NOT DETECTED NOT DETECTED   Candida auris NOT DETECTED NOT DETECTED   Candida glabrata NOT DETECTED NOT DETECTED   Candida krusei NOT DETECTED NOT DETECTED   Candida parapsilosis NOT DETECTED NOT DETECTED   Candida tropicalis NOT DETECTED NOT DETECTED   Cryptococcus neoformans/gattii NOT DETECTED NOT DETECTED   Doreene Eland, PharmD, BCPS, BCIDP Work Cell: [phone] 06/22/2022 10:53 AM

## 2022-06-22 NOTE — Progress Notes (Signed)
Progress Note  Patient Name: Walter Moody Date of Encounter: 06/22/2022  Primary Cardiologist: New - consult by Dr. Garen Lah  Subjective   Dyspnea significantly improved following thoracentesis on 06/21/2022 with 1.7 L of amber-colored fluid removed.  Cytology pending.  Postprocedure chest x-ray showed a small left-sided pneumothorax with no tension component identified.  Repeat chest x-ray this morning demonstrated a moderate-sized left-sided pneumothorax that has slightly increased in size from prior without evidence of tension component.  Hemodynamically stable.  Does not feel like he is volume up.  Reports when he does hold onto fluid it is typically in his ankles.  Renal function and Hgb stable.  Inpatient Medications    Scheduled Meds:  aspirin EC  81 mg Oral Daily   atorvastatin  40 mg Oral Daily   enoxaparin (LOVENOX) injection  40 mg Subcutaneous Q24H   FLUoxetine  20 mg Oral Daily   furosemide  40 mg Oral BID   mometasone-formoterol  2 puff Inhalation BID   polyethylene glycol  17 g Oral Daily   senna-docusate  1 tablet Oral Daily   tamsulosin  0.8 mg Oral Daily   traZODone  25 mg Oral QHS   Continuous Infusions:  PRN Meds: acetaminophen **OR** acetaminophen, albuterol, HYDROcodone-acetaminophen, ipratropium-albuterol, morphine injection, ondansetron **OR** ondansetron (ZOFRAN) IV, mouth rinse   Vital Signs    Vitals:   06/21/22 1939 06/22/22 0346 06/22/22 0404 06/22/22 0736  BP: (!) 136/59 (!) 121/52  (!) 132/57  Pulse: 88 78  77  Resp: '20 20  16  '$ Temp: 97.8 F (36.6 C) 98.3 F (36.8 C)  98.1 F (36.7 C)  TempSrc: Oral Oral  Oral  SpO2: 94% 97%  98%  Weight:   81.4 kg   Height:        Intake/Output Summary (Last 24 hours) at 06/22/2022 1254 Last data filed at 06/22/2022 0403 Gross per 24 hour  Intake 480 ml  Output 1400 ml  Net -920 ml   Filed Weights   06/20/22 1121 06/21/22 0441 06/22/22 0404  Weight: 81.6 kg 81.6 kg 81.4 kg    Telemetry     SR - Personally Reviewed  ECG    No new tracings - Personally Reviewed  Physical Exam   GEN: No acute distress.   Neck: No JVD. Cardiac: RRR, no murmurs, rubs, or gallops.  Respiratory: Mildly diminished at the bases bilaterally.  GI: Soft, nontender, non-distended.   MS: No edema; No deformity. Neuro:  Alert and oriented x 3; Nonfocal.  Psych: Normal affect.  Labs    Chemistry Recent Labs  Lab 06/20/22 1136 06/21/22 1145 06/22/22 0838  NA 141 140 139  K 3.6 3.8 3.8  CL 103 102 100  CO2 '30 30 29  '$ GLUCOSE 135* 172* 111*  BUN 36* 35* 35*  CREATININE 1.99* 2.03* 2.01*  CALCIUM 9.2 8.9 8.8*  PROT 7.4  --   --   ALBUMIN 4.3  --   --   AST 17  --   --   ALT 14  --   --   ALKPHOS 67  --   --   BILITOT 0.7  --   --   GFRNONAA 33* 32* 33*  ANIONGAP '8 8 10     '$ Hematology Recent Labs  Lab 06/20/22 1136 06/21/22 1145 06/22/22 0838  WBC 5.6 5.8 6.7  RBC 2.97* 2.66* 3.00*  HGB 8.3* 7.6* 8.5*  HCT 28.2* 25.1* 27.6*  MCV 94.9 94.4 92.0  MCH 27.9 28.6 28.3  MCHC 29.4* 30.3 30.8  RDW 17.0* 17.1* 17.1*  PLT 200 186 195    Cardiac EnzymesNo results for input(s): "TROPONINI" in the last 168 hours. No results for input(s): "TROPIPOC" in the last 168 hours.   BNP Recent Labs  Lab 06/20/22 1136  BNP 409.7*     DDimer No results for input(s): "DDIMER" in the last 168 hours.   Radiology    DG Chest Port 1 View  Result Date: 06/22/2022 IMPRESSION: Moderate-sized left-sided pneumothorax has slightly increased in size from prior. No evidence of a tension component. Electronically Signed   By: Davina Poke D.O.   On: 06/22/2022 09:27   DG Chest Port 1 View  Result Date: 06/21/2022 IMPRESSION: Similar to improving small left pneumothorax with predominant ex vacuo component. Electronically Signed   By: Albin Felling M.D.   On: 06/21/2022 15:27   US THORACENTESIS ASP PLEURAL SPACE W/IMG GUIDE  Result Date: 06/21/2022 IMPRESSION: Successful ultrasound guided  left thoracentesis yielding 1.7 L of pleural fluid. Small ex vacuo pneumothorax seen on postprocedure chest x-ray. Repeat chest x-ray approximately 3 hours later shows the ex vacuo pneumothorax to be similar to slightly improved. Read by: Soyla Dryer, NP Electronically Signed   By: Albin Felling M.D.   On: 06/21/2022 15:24   DG Chest Port 1 View  Result Date: 06/21/2022 IMPRESSION: Small left-sided pneumothorax post thoracentesis, predominately ex vacuo. No tension component identified. Critical Value/emergent results were communicated at the time of interpretation on 06/21/2022 at 11:01 am to provider [facility] , who acknowledged these results. Electronically Signed   By: Richardean Sale M.D.   On: 06/21/2022 11:02   CT Chest Wo Contrast  Result Date: 06/20/2022 IMPRESSION: There is 2.8 cm pleural-based nodular density in left lower lobe. This may suggest atelectasis/pneumonia and possibly neoplastic process. There is a 1.5 cm nodule with spiculated margins in right upper lobe. There is 8 mm pleural-based nodule in right lower lobe. Possibility of malignant neoplastic process is not excluded. Follow-up PET-CT and tissue sampling as warranted should be considered. Moderate sized left pleural effusion some of which is loculated along the lateral aspect of left lower lung field. This may suggest parapneumonic effusion or pleural metastatic disease. Severe COPD.  No significant lymphadenopathy is seen in mediastinum. Extensive coronary artery disease with previous coronary bypass surgery. Severe atherosclerotic changes are noted in abdominal aorta and its major branches. There is a 1.4 cm left renal calculus. Other findings as described in the body of the report. Electronically Signed   By: Elmer Picker M.D.   On: 06/20/2022 17:36    Cardiac Studies   2D echo 06/21/2022: 1. Left ventricular ejection fraction, by estimation, is 50%. The left  ventricle has low normal function. The left  ventricle has no regional wall  motion abnormalities. The left ventricular internal cavity size was mildly  dilated. Left ventricular  diastolic function could not be evaluated.   2. Right ventricular systolic function is low normal. The right  ventricular size is normal.   3. Left atrial size was mildly dilated.   4. The mitral valve was not well visualized. No evidence of mitral valve  regurgitation.   5. The aortic valve was not well visualized. Aortic valve regurgitation  is not visualized. Aortic valve sclerosis/calcification is present,  without any evidence of aortic stenosis.   6. The inferior vena cava is normal in size with greater than 50%  respiratory variability, suggesting right atrial pressure of 3 mmHg.  Patient  Profile     81 y.o. male with history of CAD s/p CABG, history of left PCA infarct, AAA status post endovascular repair, left common iliac artery aneurysm, status post iliac artery stenting, HFpEF, CKD stage IIIb, pulmonary nodules, cognitive dysfunction, anemia of chronic disease, COPD, and chronic hypoxic respiratory failure on 2 L supplemental oxygen who we are seeing for  Assessment & Plan    1.  HFpEF: -Noted to have a left pleural effusion upon presentation status postthoracentesis with cytology pending, lung nodules suspicious for possible malignancy -Appears largely euvolemic and well compensated on exam today -Not currently on MRA or SGLT2 inhibitor secondary to underlying renal dysfunction -PTA furosemide  2.  CAD s/p CABG with elevated high-sensitivity troponin: -No chest pain -Mildly elevated high-sensitivity troponin peaking at 35, not consistent with ACS -No indication for heparin drip -Pending clinical course, consider outpatient ischemic testing  3.  Pleural effusion status postthoracentesis complicated by pneumothorax with lung nodules suspicious for malignancy and chronic hypoxic respiratory failure on supplemental oxygen with  COPD: -Management per primary service      For questions or updates, please contact [facility] Please consult [email or web address] for contact info under Cardiology/STEMI.    Signed, Christell Faith, PA-C [place] Pager: [phone] 06/22/2022, 12:54 PM

## 2022-06-22 NOTE — Care Management Important Message (Signed)
Important Message  Patient Details  Name: Walter Moody MRN: [id number] Date of Birth: 1941/04/19   Medicare Important Message Given:  N/A - LOS <3 / Initial given by admissions     Dannette Barbara 06/22/2022, 2:09 PM

## 2022-06-22 NOTE — Progress Notes (Signed)
PROGRESS NOTE    Walter Moody  EGB:[id number] DOB: [birth year] DOA: 06/20/2022 PCP: Jacklynn Barnacle, MD    Brief Narrative:  81 y.o. 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  He has been coughing but denies fever or chills.  Denies chest pain. ED course and data review: Vitals within normal limits except for mild tachypnea of 22-25.  Labs with troponin 35 and BNP 4 7, WBC 5.6 with lactic acid 0.8 and procalcitonin pending.  Hemoglobin 8.3 which is his baseline and creatinine 1.99 which is at baseline  Status post ultrasound-guided thoracentesis on 9/6.  1.7 L fluid liberated.  Sent to lab for fluid studies and cytology.  Small ex vacuo pneumothorax identified.  Patient relatively asymptomatic.  9/7: Kidney function relatively stable.  Patient net negative.  Repeat chest x-ray this morning shows persistent left-sided pneumothorax without radiographic signs of tension.  Interventional radiology made aware.  We will place patient back on oxygen.  Assessment & Plan:   Principal Problem:   Acute on chronic diastolic CHF (congestive heart failure) (HCC) Active Problems:   Pleural effusion on left   Acute on chronic dyspnea   Multiple pulmonary nodules   CAD with hx of CABG   COPD (chronic obstructive pulmonary disease) with emphysema (HCC)   History of AAA (abdominal  aortic aneurysm) repair   Stage 3b chronic kidney disease (HCC)   Chronic respiratory failure with hypoxia (HCC)   History of stroke   Elevated troponin   Dementia without behavioral disturbance (HCC)   Chronic back pain   Respiratory failure with hypoxia (HCC)  * Acute on chronic diastolic CHF (congestive heart failure) ([place]) Patient presents with shortness of breath, dyspnea on exertion lower extremity edema, pleural effusion, elevated troponin of 35 which could be demand ischemia as well as BNP elevated at 409 TTE, EF 50% with indeterminate diastolic function Kidney function stable Patient net negative Plan: Continue p.o. Lasix 40 mg twice daily Cardiology follow-up   Pleural effusion on left IR consulted for thoracentesis to help determine etiology of pleural effusion CT chest showing moderate-sized left pleural effusion some of which is loculated suggesting parapneumonic effusion or pleural metastatic disease -Status post ultrasound-guided thoracentesis 9/6.  1.7 L fluid liberated -Left-sided pneumothorax persistent on 9/7 Plan: Follow fluid studies including cytology  Left-sided pneumothorax Ex vacuo status post thoracentesis Slightly increased in size on 9/7 Interventional radiology made aware Plan: Placed on oxygen 2 L nasal cannula Serial chest x-rays until resolution    Acute on chronic dyspnea Multifactorial related to pleural effusion, CHF with exacerbation, possible malignancy based on CT findings with chronic physical deconditioning.  Lower suspicion for infectious process and lower suspicion for PE O2 requirement is at baseline so continue home O2 Treat primary suspected etiologies of CHF and pleural effusion of undetermined etiology as outlined in note   CAD with hx of CABG Elevated troponin Troponin of 35  but without complaints of chest pain and with nonacute EKG Elevated troponin likely related to demand ischemia from ongoing dyspnea Continue aspirin,  atorvastatin   Multiple pulmonary nodules Patient has history of multiple stable pulmonary nodules per review of PCPs note. Current CT was not compared to prior stating none was available Current CT showing multiple nodules somewhat spiculated margins Suggest reread with comparison to prior, otherwise recommendation for follow-up PET CT and tissue sampling is recommended by radiologist Follow-up cytology from thoracentesis   Chronic back pain Physical deconditioning and ambulatory dysfunction At baseline ambulates with a walker Hydrocodone for pain PT/OT TOC consults   Dementia without behavioral disturbance ([place]) Delirium precautions --Get up during the day --Keep blinds open and lights on during daylight hours --Minimize the use of opioids/benzodiazepines --Reorient the patient frequently, provide easily visible clock and calendar --Provide sensory aids like glasses, hearing aids --Encourage ambulation, regular activities and visitors to maintain cognitive stimulation  --Patient would benefit from having family members at bedside to reinforce his orientation.       History of stroke Continue aspirin and statin   Stage 3b chronic kidney disease ([place]) Renal function at baseline   History of AAA (abdominal aortic aneurysm) repair S/p celiac artery stenting Common iliac artery aneurysm PAD Continue aspirin and statin   COPD (chronic obstructive pulmonary disease) with emphysema (HCC) Chronic respiratory failure with hypoxia Not acutely exacerbated Continue home inhaler DuoNebs as needed Patient's O2 requirement is at baseline Continue supplemental oxygen   DVT prophylaxis: SQ Lovenox Code Status: DNR Family Communication: Daughter [name] [phone] on 9/6 Disposition Plan: Status is: Inpatient Remains inpatient appropriate because: Multifactorial shortness of breath.  Symptomatic pleural effusion.  Decompensated heart failure.  New  pneumothorax   Level of care: Med-Surg  Consultants:  Cardiology-[facility] Interventional radiology  Procedures:  Ultrasound-guided thoracentesis 9/6  Antimicrobials: None   Subjective: Patient seen and examined.  Resting comfortably in bed.  Shortness of breath improved. Objective: Vitals:   06/21/22 1939 06/22/22 0346 06/22/22 0404 06/22/22 0736  BP: (!) 136/59 (!) 121/52  (!) 132/57  Pulse: 88 78  77  Resp: '20 20  16  '$ Temp: 97.8 F (36.6 C) 98.3 F (36.8 C)  98.1 F (36.7 C)  TempSrc: Oral Oral  Oral  SpO2: 94% 97%  98%  Weight:   81.4 kg   Height:        Intake/Output Summary (Last 24 hours) at 06/22/2022 1008 Last data filed at 06/22/2022 0403 Gross per 24 hour  Intake 480 ml  Output 1400 ml  Net -920 ml   Filed Weights   06/20/22 1121 06/21/22 0441 06/22/22 0404  Weight: 81.6 kg 81.6 kg 81.4 kg    Examination:  General exam: NAD.  Appears fatigued Respiratory system: Mild bibasilar crackles.  Normal work of breathing.  2 L Cardiovascular system: S1-S2, RRR, no murmurs, no pedal edema Gastrointestinal system: Soft, NT/ND, normal bowel sounds Central nervous system: Alert, oriented x2, no focal deficits Extremities: Symmetric 5 x 5 power. Skin: No rashes, lesions or ulcers Psychiatry: Judgement and insight appear normal. Mood & affect appropriate.     Data Reviewed: I have personally reviewed following labs and imaging studies  CBC: Recent Labs  Lab 06/20/22 1136 06/21/22 1145 06/22/22 0838  WBC 5.6 5.8 6.7  NEUTROABS 4.1 4.2 4.8  HGB 8.3* 7.6* 8.5*  HCT 28.2* 25.1* 27.6*  MCV 94.9 94.4 92.0  PLT 200 186 540   Basic Metabolic Panel: Recent Labs  Lab 06/20/22 1136  06/21/22 1145 06/22/22 0838  NA 141 140 139  K 3.6 3.8 3.8  CL 103 102 100  CO2 '30 30 29  '$ GLUCOSE 135* 172* 111*  BUN 36* 35* 35*  CREATININE 1.99* 2.03* 2.01*  CALCIUM 9.2 8.9 8.8*  MG 2.5*  --  2.4   GFR: Estimated Creatinine Clearance: 31.6 mL/min (A) (by C-G formula  based on SCr of 2.01 mg/dL (H)). Liver Function Tests: Recent Labs  Lab 06/20/22 1136  AST 17  ALT 14  ALKPHOS 67  BILITOT 0.7  PROT 7.4  ALBUMIN 4.3   No results for input(s): "LIPASE", "AMYLASE" in the last 168 hours. No results for input(s): "AMMONIA" in the last 168 hours. Coagulation Profile: No results for input(s): "INR", "PROTIME" in the last 168 hours. Cardiac Enzymes: No results for input(s): "CKTOTAL", "CKMB", "CKMBINDEX", "TROPONINI" in the last 168 hours. BNP (last 3 results) No results for input(s): "PROBNP" in the last 8760 hours. HbA1C: No results for input(s): "HGBA1C" in the last 72 hours. CBG: No results for input(s): "GLUCAP" in the last 168 hours. Lipid Profile: No results for input(s): "CHOL", "HDL", "LDLCALC", "TRIG", "CHOLHDL", "LDLDIRECT" in the last 72 hours. Thyroid Function Tests: No results for input(s): "TSH", "T4TOTAL", "FREET4", "T3FREE", "THYROIDAB" in the last 72 hours. Anemia Panel: No results for input(s): "VITAMINB12", "FOLATE", "FERRITIN", "TIBC", "IRON", "RETICCTPCT" in the last 72 hours. Sepsis Labs: Recent Labs  Lab 06/20/22 1538 06/20/22 2017  PROCALCITON <0.10  --   LATICACIDVEN  --  0.8    Recent Results (from the past 240 hour(s))  Resp Panel by RT-PCR (Flu A&B, Covid) Anterior Nasal Swab     Status: None   Collection Time: 06/20/22  4:26 PM   Specimen: Anterior Nasal Swab  Result Value Ref Range Status   SARS Coronavirus 2 by RT PCR NEGATIVE NEGATIVE Final    Comment: (NOTE) SARS-CoV-2 target nucleic acids are NOT DETECTED.  The SARS-CoV-2 RNA is generally detectable in upper respiratory specimens during the acute phase of infection. The lowest concentration of SARS-CoV-2 viral copies this assay can detect is 138 copies/mL. A negative result does not preclude SARS-Cov-2 infection and should not be used as the sole basis for treatment or other patient management decisions. A negative result may occur with  improper  specimen collection/handling, submission of specimen other than nasopharyngeal swab, presence of viral mutation(s) within the areas targeted by this assay, and inadequate number of viral copies(<138 copies/mL). A negative result must be combined with clinical observations, patient history, and epidemiological information. The expected result is Negative.  Fact Sheet for Patients:  EntrepreneurPulse.com.au  Fact Sheet for Healthcare Providers:  IncredibleEmployment.be  This test is no t yet approved or cleared by the [place] FDA and  has been authorized for detection and/or diagnosis of SARS-CoV-2 by FDA under an Emergency Use Authorization (EUA). This EUA will remain  in effect (meaning this test can be used) for the duration of the COVID-19 declaration under Section 564(b)(1) of the Act, 21 U.S.C.section 360bbb-3(b)(1), unless the authorization is terminated  or revoked sooner.       Influenza A by PCR NEGATIVE NEGATIVE Final   Influenza B by PCR NEGATIVE NEGATIVE Final    Comment: (NOTE) The Xpert Xpress SARS-CoV-2/FLU/RSV plus assay is intended as an aid in the diagnosis of influenza from Nasopharyngeal swab specimens and should not be used as a sole basis for treatment. Nasal washings and aspirates are unacceptable for Xpert Xpress SARS-CoV-2/FLU/RSV testing.  Fact Sheet for Patients: EntrepreneurPulse.com.au  Fact Sheet for Healthcare Providers: IncredibleEmployment.be  This test is not yet approved or cleared by the [place] FDA and has been authorized for detection and/or diagnosis of SARS-CoV-2 by FDA under an Emergency Use Authorization (EUA). This EUA will remain in effect (meaning this test can be used) for the duration of the COVID-19 declaration under Section 564(b)(1) of the Act, 21 U.S.C. section 360bbb-3(b)(1), unless the authorization is terminated or revoked.  Performed at  [facility], [address] [place], [place] [address]   Blood culture (routine x 2)     Status: None (Preliminary result)   Collection Time: 06/20/22  8:17 PM   Specimen: BLOOD  Result Value Ref Range Status   Specimen Description BLOOD BLOOD LEFT FOREARM  Final   Special Requests   Final    BOTTLES DRAWN AEROBIC AND ANAEROBIC Blood Culture results may not be optimal due to an excessive volume of blood received in culture bottles   Culture   Final    NO GROWTH 2 DAYS Performed at [facility], [address], [place], [place] [address]    Report Status PENDING  Incomplete  Blood culture (routine x 2)     Status: None (Preliminary result)   Collection Time: 06/20/22  8:17 PM   Specimen: BLOOD  Result Value Ref Range Status   Specimen Description BLOOD BLOOD LEFT ARM  Final   Special Requests   Final    BOTTLES DRAWN AEROBIC AND ANAEROBIC Blood Culture results may not be optimal due to an excessive volume of blood received in culture bottles   Culture  Setup Time   Final    GRAM POSITIVE COCCI AEROBIC BOTTLE ONLY Organism ID to follow Performed at [facility], [address] [place], [place] [address]    Culture GRAM POSITIVE COCCI  Final   Report Status PENDING  Incomplete  Blood Culture ID Panel (Reflexed)     Status: Abnormal   Collection Time: 06/20/22  8:17 PM  Result Value Ref Range Status   Enterococcus faecalis NOT DETECTED NOT DETECTED Final   Enterococcus Faecium NOT DETECTED NOT DETECTED Final   Listeria monocytogenes NOT DETECTED NOT DETECTED Final   Staphylococcus species DETECTED (A) NOT DETECTED Final    Comment: CRITICAL RESULT CALLED TO, READ BACK BY AND VERIFIED WITH: DEVAN MITCHELL PHARMD 1005 06/22/22 HNM    Staphylococcus aureus (BCID) NOT DETECTED NOT DETECTED Final   Staphylococcus epidermidis NOT DETECTED NOT DETECTED Final   Staphylococcus lugdunensis NOT DETECTED NOT DETECTED Final   Streptococcus species NOT DETECTED  NOT DETECTED Final   Streptococcus agalactiae NOT DETECTED NOT DETECTED Final   Streptococcus pneumoniae NOT DETECTED NOT DETECTED Final   Streptococcus pyogenes NOT DETECTED NOT DETECTED Final   A.calcoaceticus-baumannii NOT DETECTED NOT DETECTED Final   Bacteroides fragilis NOT DETECTED NOT DETECTED Final   Enterobacterales NOT DETECTED NOT DETECTED Final   Enterobacter cloacae complex NOT DETECTED NOT DETECTED Final   Escherichia coli NOT DETECTED NOT DETECTED Final   Klebsiella aerogenes NOT DETECTED NOT DETECTED Final   Klebsiella oxytoca NOT DETECTED NOT DETECTED Final   Klebsiella pneumoniae NOT DETECTED NOT DETECTED Final   Proteus species NOT DETECTED NOT DETECTED Final   Salmonella species NOT DETECTED NOT DETECTED Final   Serratia marcescens NOT DETECTED NOT DETECTED Final   Haemophilus influenzae NOT DETECTED NOT DETECTED Final   Neisseria meningitidis NOT DETECTED NOT DETECTED Final   Pseudomonas aeruginosa NOT DETECTED NOT DETECTED Final   Stenotrophomonas maltophilia NOT  DETECTED NOT DETECTED Final   Candida albicans NOT DETECTED NOT DETECTED Final   Candida auris NOT DETECTED NOT DETECTED Final   Candida glabrata NOT DETECTED NOT DETECTED Final   Candida krusei NOT DETECTED NOT DETECTED Final   Candida parapsilosis NOT DETECTED NOT DETECTED Final   Candida tropicalis NOT DETECTED NOT DETECTED Final   Cryptococcus neoformans/gattii NOT DETECTED NOT DETECTED Final    Comment: Performed at [facility], [address] [place], [place] [address]  Body fluid culture w Gram Stain     Status: None (Preliminary result)   Collection Time: 06/21/22 10:25 AM   Specimen: PATH Cytology Pleural fluid  Result Value Ref Range Status   Specimen Description   Final    PLEURAL Performed at [facility], [address], [place], [place] [address]    Special Requests   Final    NONE Performed at [facility], [address] [place], [place] [address]     Gram Stain NO WBC SEEN NO ORGANISMS SEEN   Final   Culture   Final    NO GROWTH < 24 HOURS Performed at [facility], [address] [place], [place] [address]    Report Status PENDING  Incomplete         Radiology Studies: DG Chest Port 1 View  Result Date: 06/22/2022 CLINICAL DATA:  Follow-up pneumothorax EXAM: PORTABLE CHEST 1 VIEW COMPARISON:  06/21/2022 FINDINGS: Stable cardiomediastinal contours status post sternotomy and CABG. Moderate-sized left-sided pneumothorax has slightly increased in size from prior. No abnormal shift of the heart or mediastinal structures to suggest a tension component. Slightly increasing opacities within the left lung. Minimal right basilar atelectasis. No right-sided pneumothorax. IMPRESSION: Moderate-sized left-sided pneumothorax has slightly increased in size from prior. No evidence of a tension component. Electronically Signed   By: Davina Poke D.O.   On: 06/22/2022 09:27   ECHOCARDIOGRAM COMPLETE  Result Date: 06/21/2022    ECHOCARDIOGRAM REPORT   Patient Name:   GAYLON BENTZ [facility] Date of Exam: 06/21/2022 Medical Rec #:  [id number]            Height:       72.0 in Accession #:    [id number]           Weight:       179.9 lb Date of Birth:  26-Sep-1941             BSA:          2.037 m Patient Age:    53 years             BP:           125/56 mmHg Patient Gender: M                    HR:           95 bpm. Exam Location:  [facility] Procedure: 2D Echo, Cardiac Doppler and Color Doppler Indications:     CHF-acute diastolic O35.00  History:         Patient has no prior history of Echocardiogram examinations.                  CHF, COPD and Stroke; Risk Factors:Hypertension. Heart attack.  Sonographer:     [name] Referring Phys:  [id number] Athena Masse Diagnosing Phys: Kate Sable MD  Sonographer Comments: Technically challenging study due to limited acoustic windows and no apical window. Image acquisition challenging due to  COPD. IMPRESSIONS  1.  Left ventricular ejection fraction, by estimation, is 50%. The left ventricle has low normal function. The left ventricle has no regional wall motion abnormalities. The left ventricular internal cavity size was mildly dilated. Left ventricular diastolic function could not be evaluated.  2. Right ventricular systolic function is low normal. The right ventricular size is normal.  3. Left atrial size was mildly dilated.  4. The mitral valve was not well visualized. No evidence of mitral valve regurgitation.  5. The aortic valve was not well visualized. Aortic valve regurgitation is not visualized. Aortic valve sclerosis/calcification is present, without any evidence of aortic stenosis.  6. The inferior vena cava is normal in size with greater than 50% respiratory variability, suggesting right atrial pressure of 3 mmHg. FINDINGS  Left Ventricle: Left ventricular ejection fraction, by estimation, is 50%. The left ventricle has low normal function. The left ventricle has no regional wall motion abnormalities. The left ventricular internal cavity size was mildly dilated. There is no left ventricular hypertrophy. Left ventricular diastolic function could not be evaluated. Right Ventricle: The right ventricular size is normal. No increase in right ventricular wall thickness. Right ventricular systolic function is low normal. Left Atrium: Left atrial size was mildly dilated. Right Atrium: Right atrial size was normal in size. Pericardium: There is no evidence of pericardial effusion. Mitral Valve: The mitral valve was not well visualized. No evidence of mitral valve regurgitation. Tricuspid Valve: The tricuspid valve is normal in structure. Tricuspid valve regurgitation is not demonstrated. Aortic Valve: The aortic valve was not well visualized. Aortic valve regurgitation is not visualized. Aortic valve sclerosis/calcification is present, without any evidence of aortic stenosis. Pulmonic Valve: The pulmonic valve was not well  visualized. Pulmonic valve regurgitation is not visualized. Aorta: The aortic root is normal in size and structure. Venous: The inferior vena cava is normal in size with greater than 50% respiratory variability, suggesting right atrial pressure of 3 mmHg. IAS/Shunts: No atrial level shunt detected by color flow Doppler.  LEFT VENTRICLE PLAX 2D LVIDd:         6.10 cm LVIDs:         4.40 cm LV PW:         0.90 cm LV IVS:        0.80 cm LVOT diam:     2.10 cm LVOT Area:     3.46 cm  LEFT ATRIUM         Index LA diam:    5.30 cm 2.60 cm/m   AORTA Ao Root diam: 2.80 cm  SHUNTS Systemic Diam: 2.10 cm Kate Sable MD Electronically signed by Kate Sable MD Signature Date/Time: 06/21/2022/4:14:53 PM    Final    DG Chest Port 1 View  Result Date: 06/21/2022 CLINICAL DATA:  Follow-up pneumothorax ex vacuo EXAM: PORTABLE CHEST 1 VIEW COMPARISON:  Earlier same day FINDINGS: Stable appearance of the cardiomediastinal silhouette status post median sternotomy, with similar enlargement of the heart. No pleural effusion. Improving apical component of small left pneumothorax. Similar to improved basilar component of suspected ex vacuo pneumothorax. No lobar consolidation. IMPRESSION: Similar to improving small left pneumothorax with predominant ex vacuo component. Electronically Signed   By: Albin Felling M.D.   On: 06/21/2022 15:27   US THORACENTESIS ASP PLEURAL SPACE W/IMG GUIDE  Result Date: 06/21/2022 INDICATION: Patient presented to the hospital with cough and shortness of breath. Found to have a left pleural effusion. Interventional radiology requested to perform a therapeutic and diagnostic thoracentesis.  EXAM: ULTRASOUND GUIDED THORACENTESIS MEDICATIONS: 1% lidocaine 15 mL COMPLICATIONS: SIR Level A - No therapy, no consequence. PROCEDURE: An ultrasound guided thoracentesis was thoroughly discussed with the patient and questions answered. The benefits, risks, alternatives and complications were also  discussed. The patient understands and wishes to proceed with the procedure. Written consent was obtained. Ultrasound was performed to localize and mark an adequate pocket of fluid in the left chest. The area was then prepped and draped in the normal sterile fashion. 1% Lidocaine was used for local anesthesia. Under ultrasound guidance a 6 Fr Safe-T-Centesis catheter was introduced. Thoracentesis was performed. The catheter was removed and a dressing applied. FINDINGS: A total of approximately 1.7 L of amber-colored fluid was removed. Samples were sent to the laboratory as requested by the clinical team. IMPRESSION: Successful ultrasound guided left thoracentesis yielding 1.7 L of pleural fluid. Small ex vacuo pneumothorax seen on postprocedure chest x-ray. Repeat chest x-ray approximately 3 hours later shows the ex vacuo pneumothorax to be similar to slightly improved. Read by: Soyla Dryer, NP Electronically Signed   By: Albin Felling M.D.   On: 06/21/2022 15:24   DG Chest Port 1 View  Result Date: 06/21/2022 CLINICAL DATA:  Status post thoracentesis on the left. EXAM: PORTABLE CHEST 1 VIEW COMPARISON:  CT and radiographs 06/20/2022. FINDINGS: 1039 hours. Interval near complete evacuation of the previously demonstrated left-sided pleural effusion. There is a small left-sided pneumothorax with apical and basilar components, partially ex vacuo. No resulting mediastinal shift. There is improved aeration of the left lung with mild residual left basilar atelectasis. The right lung is clear. The heart size and mediastinal contours are stable post median sternotomy. Previous cervical fusion noted. IMPRESSION: Small left-sided pneumothorax post thoracentesis, predominately ex vacuo. No tension component identified. Critical Value/emergent results were communicated at the time of interpretation on 06/21/2022 at 11:01 am to provider [facility] , who acknowledged these results. Electronically Signed   By: Richardean Sale M.D.   On: 06/21/2022 11:02   CT Chest Wo Contrast  Result Date: 06/20/2022 CLINICAL DATA:  Cough, shortness of breath x2 weeks EXAM: CT CHEST WITHOUT CONTRAST TECHNIQUE: Multidetector CT imaging of the chest was performed following the standard protocol without IV contrast. RADIATION DOSE REDUCTION: This exam was performed according to the departmental dose-optimization program which includes automated exposure control, adjustment of the mA and/or kV according to patient size and/or use of iterative reconstruction technique. COMPARISON:  None Available. FINDINGS: Cardiovascular: Extensive coronary artery calcifications are seen. There is previous coronary artery bypass. Heart is enlarged in size. Mediastinum/Nodes: No significant lymphadenopathy is seen. Lungs/Pleura: Centrilobular and panlobular emphysema is seen. In image 65 of series 5, there is 1.5 x 1.3 cm nodule with spiculated margins in the anterolateral aspect of right upper lobe. There is moderate to large left pleural effusion part of which appears to be loculated in the lateral aspect of left lower lung field. There is no significant right pleural effusion. Linear infiltrates are seen in lingula. In image 112 of series 3, there is 2.8 x 1.7 cm pleural-based nodular infiltrate in left lower lobe. In image 109, there is 8 mm pleural-based nodule in right lower lobe. Fibrotic changes are noted in right middle lobe, anterior left upper lobe and anterior right upper lobe. There is no pneumothorax. Upper Abdomen: There is previous endovascular stent repair in abdominal aorta. A stent is noted in proximal courses of celiac and SMA. There is 1.4 cm calculus in the upper pole of left  kidney. Musculoskeletal: Decrease in height of lower endplate of body of L2 vertebra has not changed. There is mild decrease in height of upper endplate of body of L1 vertebra with no significant change. IMPRESSION: There is 2.8 cm pleural-based nodular density in left  lower lobe. This may suggest atelectasis/pneumonia and possibly neoplastic process. There is a 1.5 cm nodule with spiculated margins in right upper lobe. There is 8 mm pleural-based nodule in right lower lobe. Possibility of malignant neoplastic process is not excluded. Follow-up PET-CT and tissue sampling as warranted should be considered. Moderate sized left pleural effusion some of which is loculated along the lateral aspect of left lower lung field. This may suggest parapneumonic effusion or pleural metastatic disease. Severe COPD.  No significant lymphadenopathy is seen in mediastinum. Extensive coronary artery disease with previous coronary bypass surgery. Severe atherosclerotic changes are noted in abdominal aorta and its major branches. There is a 1.4 cm left renal calculus. Other findings as described in the body of the report. Electronically Signed   By: Elmer Picker M.D.   On: 06/20/2022 17:36   DG Chest 2 View  Result Date: 06/20/2022 CLINICAL DATA:  Shortness of breath EXAM: CHEST - 2 VIEW COMPARISON:  None Available. FINDINGS: Cardiomegaly status post median sternotomy and CABG. Moderate left pleural effusion associated atelectasis or consolidation. Diffuse bilateral interstitial opacity. Underlying emphysema. Disc degenerative disease of the thoracic spine. IMPRESSION: 1. Moderate left pleural effusion and associated atelectasis or consolidation. 2. Diffuse bilateral interstitial opacity, consistent with edema and/or infection. No focal airspace opacity. 3. Underlying emphysema. Electronically Signed   By: Delanna Ahmadi M.D.   On: 06/20/2022 12:15        Scheduled Meds:  aspirin EC  81 mg Oral Daily   atorvastatin  40 mg Oral Daily   enoxaparin (LOVENOX) injection  40 mg Subcutaneous Q24H   FLUoxetine  20 mg Oral Daily   furosemide  40 mg Oral BID   mometasone-formoterol  2 puff Inhalation BID   polyethylene glycol  17 g Oral Daily   potassium chloride  40 mEq Oral Once    senna-docusate  1 tablet Oral Daily   tamsulosin  0.8 mg Oral Daily   traZODone  25 mg Oral QHS   Continuous Infusions:   LOS: 1 day     Sidney Ace, MD [facility]   If 7PM-7AM, please contact night-coverage  06/22/2022, 10:08 AM

## 2022-06-22 NOTE — Progress Notes (Signed)
Provided patient with written and verbal heart failure education, including "Living Better with Heart Failure" booklet. Reviewed with patient following a low sodium diet of 2000 mg sodium or less per day, obtaining daily weights at home, and the signs and symptoms that should prompt a call to the clinic. Patient was previously familiar with these instructions and states he has already been following these instructions at home. Patient given verbal and written information about New Patient appointment with the [facility] scheduled 07/05/22 at 9 AM. Patient denies transportation issues to get to appointment, and states he will plan to be there. Georg Ruddle, RN

## 2022-06-22 NOTE — TOC Initial Note (Signed)
Transition of Care [facility]) - Initial/Assessment Note    Patient Details  Name: Walter Moody MRN: [id number] Date of Birth: 06/02/1941  [facility] CM/SW Contact:    Ninfa Meeker, RN Phone Number: 06/22/2022, 10:03 AM  Clinical Narrative:                 Case manager spoke with patient's wife, Horris Latino [phone], and spoke with patient, concerning recommendation for Home Health therapy. Mrs. Mitro said that they have had therapy many times and usually her husband refuses. She asked that I confirm with him what he wants to do. Patient states he doesn't want it at all. CM informed both patient and wife if he changes his mind to let staff know.       Barriers to Discharge: Continued Medical Work up   Patient Goals and CMS Choice     Choice offered to / list presented to : Patient, Spouse  Expected Discharge Plan and Services         Living arrangements for the past 2 months: Single Family Home                           HH Arranged: Patient Refused HH          Prior Living Arrangements/Services Living arrangements for the past 2 months: Single Family Home Lives with:: Spouse Patient language and need for interpreter reviewed:: Yes        Need for Family Participation in Patient Care: Yes (Comment) Care giver support system in place?: Yes (comment)   Criminal Activity/Legal Involvement Pertinent to Current Situation/Hospitalization: No - Comment as needed  Activities of Daily Living Home Assistive Devices/Equipment: None ADL Screening (condition at time of admission) Patient's cognitive ability adequate to safely complete daily activities?: Yes Is the patient deaf or have difficulty hearing?: Yes Does the patient have difficulty seeing, even when wearing glasses/contacts?: No Does the patient have difficulty concentrating, remembering, or making decisions?: No Patient able to express need for assistance with ADLs?: Yes Does the patient  have difficulty dressing or bathing?: Yes Independently performs ADLs?: No Communication: Independent Dressing (OT): Needs assistance Is this a change from baseline?: Change from baseline, expected to last <3days Grooming: Independent Feeding: Independent Bathing: Needs assistance Is this a change from baseline?: Change from baseline, expected to last <3 days Toileting: Needs assistance Is this a change from baseline?: Change from baseline, expected to last <3 days In/Out Bed: Independent Walks in Home: Independent Does the patient have difficulty walking or climbing stairs?: Yes Weakness of Legs: Both Weakness of Arms/Hands: Both  Permission Sought/Granted                  Emotional Assessment   Attitude/Demeanor/Rapport: Gracious     Alcohol / Substance Use: Not Applicable Psych Involvement: No (comment)  Admission diagnosis:  Pleural effusion on left [J90] Respiratory failure with hypoxia (HCC) [J96.91] Patient Active Problem List   Diagnosis Date Noted   Respiratory failure with hypoxia ([place]) 06/21/2022   Pleural effusion on left 06/20/2022   Stage 3b chronic kidney disease ([place]) 06/20/2022   CAD with hx of CABG 06/20/2022   Acute on chronic diastolic CHF (congestive heart failure) ([place]) 06/20/2022   Chronic respiratory failure with hypoxia ([place]) 06/20/2022   Acute on chronic dyspnea 06/20/2022   History of stroke 06/20/2022   Elevated troponin 06/20/2022   Dementia without behavioral disturbance ([place]) 06/20/2022   Chronic back pain  06/20/2022   Age-related cataract of both eyes 11/23/2020   Chronic midline low back pain without sciatica 03/31/2019   Risk for falls 12/10/2018   Left inguinal hernia 06/11/2017   Claudication of both lower extremities ([place]) 03/21/2017   Benign prostatic hyperplasia with lower urinary tract symptoms 05/24/2016   Iliac artery aneurysm, 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   Constipation 10/19/2015   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    Atherosclerotic vascular disease 07/22/2014   History of AAA (abdominal aortic aneurysm) repair 06/04/2014   Anemia 11/26/2013   Hypertension 11/26/2013   History of nonmelanoma skin cancer 10/27/2013   Actinic keratoses 03/05/2013   Multiple pulmonary nodules 03/05/2013   COPD (chronic obstructive pulmonary disease) with emphysema ([place]) 12/22/2004   PCP:  Jacklynn Barnacle, MD Pharmacy:   [facility] [address]), [place] [address] [place]) [place] [address] Phone: [phone] Fax: [phone]     Social Determinants of Health (SDOH) Interventions    Readmission Risk Interventions     No data to display

## 2022-06-22 NOTE — Evaluation (Signed)
Physical Therapy Evaluation Patient Details Name: Walter Moody MRN: [id number] DOB: [birth year] Today's Date: 06/22/2022  History of Present Illness  Walter Moody is a 81 y.o. 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  Clinical Impression  Pt is a pleasant 81 year old male who was admitted for acute on chronic CHF. Pt performs bed mobility with cga, transfers with min assist, and ambulation with cga and RW. Pt demonstrates deficits with strength/endurance. Would benefit from skilled PT to address above deficits and promote optimal return to PLOF. Recommend transition to [place] upon discharge from acute hospitalization.      Recommendations for follow up therapy are one component of a multi-disciplinary discharge planning process, led by the attending physician.  Recommendations may be updated based on patient status, additional functional criteria and insurance authorization.  Follow Up Recommendations Home health PT      Assistance Recommended at Discharge Set up Supervision/Assistance  Patient can return home with the following  A little help with walking and/or transfers;Help with stairs or ramp for entrance    Equipment Recommendations None recommended by PT  Recommendations for Other Services       Functional Status Assessment Patient has had a recent decline  in their functional status and demonstrates the ability to make significant improvements in function in a reasonable and predictable amount of time.     Precautions / Restrictions Precautions Precautions: Fall Restrictions Weight Bearing Restrictions: No      Mobility  Bed Mobility Overal bed mobility: Needs Assistance Bed Mobility: Supine to Sit     Supine to sit: Min guard     General bed mobility comments: safe technique and follows commands well. Once seated at EOB, upright posture noted. All mobility peformed on 2L of O2    Transfers Overall transfer level: Needs assistance Equipment used: Rolling walker (2 wheels) Transfers: Sit to/from Stand Sit to Stand: Min assist           General transfer comment: forward flexed posture with heavy B UE support on RW    Ambulation/Gait Ambulation/Gait assistance: Min guard Gait Distance (Feet): 80 Feet Assistive device: Rolling walker (2 wheels) Gait Pattern/deviations: Step-through pattern       General Gait Details: ambulated with short stride and 2 standing rest breaks due to SOB symptoms. All mobility performed on 2L with sats at 91-95%. HR at 88bpm with exertion  Stairs            Wheelchair Mobility    Modified Rankin (Stroke Patients Only)       Balance Overall balance assessment: Needs assistance Sitting-balance support: Feet supported, No upper extremity supported Sitting balance-Leahy Scale: Fair     Standing balance support: During functional activity, No upper extremity supported Standing balance-Leahy Scale: Fair  Pertinent Vitals/Pain Pain Assessment Pain Assessment: No/denies pain    Home Living Family/patient expects to be discharged to:: Private residence Living Arrangements: Spouse/significant other Available Help at Discharge: Family;Available 24 hours/day Type of Home: Mobile home Home Access: Ramped entrance       Home Layout: One  level Home Equipment: Rollator (4 wheels);Shower seat Additional Comments: Wife home 24/7 (has COPD), daughter lives ~15 min away    Prior Function Prior Level of Function : Needs assist             Mobility Comments: Mod I with rollator for household distances, does not really leave the house ever. Pt reports no recent falls ADLs Comments: Pt reports wife helps him with "everything" including ADLs/IADLs. Wife or daughter drives pt to appointments, wife completes cooking/cleaning/laundry, receives meals on wheels.     Hand Dominance        Extremity/Trunk Assessment   Upper Extremity Assessment Upper Extremity Assessment: Generalized weakness (B UE grossly 4/5)    Lower Extremity Assessment Lower Extremity Assessment: Generalized weakness (B LE grossly 4/5)       Communication   Communication: No difficulties  Cognition Arousal/Alertness: Awake/alert Behavior During Therapy: WFL for tasks assessed/performed Overall Cognitive Status: History of cognitive impairments - at baseline                                 General Comments: pleasant and agreeable to session        General Comments      Exercises     Assessment/Plan    PT Assessment Patient needs continued PT services  PT Problem List Cardiopulmonary status limiting activity;Decreased mobility;Decreased balance;Decreased strength       PT Treatment Interventions Gait training;Therapeutic exercise;Balance training    PT Goals (Current goals can be found in the Care Plan section)  Acute Rehab PT Goals Patient Stated Goal: to go home PT Goal Formulation: With patient Time For Goal Achievement: 07/06/22 Potential to Achieve Goals: Good    Frequency Min 2X/week     Co-evaluation               AM-PAC PT "6 Clicks" Mobility  Outcome Measure Help needed turning from your back to your side while in a flat bed without using bedrails?: A Little Help needed moving from lying on your  back to sitting on the side of a flat bed without using bedrails?: A Little Help needed moving to and from a bed to a chair (including a wheelchair)?: A Little Help needed standing up from a chair using your arms (e.g., wheelchair or bedside chair)?: A Little Help needed to walk in hospital room?: A Little Help needed climbing 3-5 steps with a railing? : A Lot 6 Click Score: 17    End of Session Equipment Utilized During Treatment: Gait belt;Oxygen Activity Tolerance: Patient tolerated treatment well Patient left: in chair;with chair alarm set Nurse Communication: Mobility status PT Visit Diagnosis: Unsteadiness on feet (R26.81);Muscle weakness (generalized) (M62.81);Difficulty in walking, not elsewhere classified (R26.2)    Time: 2130-8657 PT Time Calculation (min) (ACUTE ONLY): 22 min   Charges:   PT Evaluation $PT Eval Low Complexity: 1 Low PT Treatments $Gait Training: 8-22 mins        Greggory Stallion, PT, DPT, GCS [phone]   Shirely Toren 06/22/2022, 3:37 PM

## 2022-06-23 ENCOUNTER — Inpatient Hospital Stay: Payer: Medicare Other

## 2022-06-23 DIAGNOSIS — I5033 Acute on chronic diastolic (congestive) heart failure: Secondary | ICD-10-CM | POA: Diagnosis not present

## 2022-06-23 LAB — BASIC METABOLIC PANEL
Anion gap: 9 (ref 5–15)
BUN: 42 mg/dL — ABNORMAL HIGH (ref 8–23)
CO2: 28 mmol/L (ref 22–32)
Calcium: 8.8 mg/dL — ABNORMAL LOW (ref 8.9–10.3)
Chloride: 101 mmol/L (ref 98–111)
Creatinine, Ser: 2.12 mg/dL — ABNORMAL HIGH (ref 0.61–1.24)
GFR, Estimated: 31 mL/min — ABNORMAL LOW (ref >60)
Glucose, Bld: 126 mg/dL — ABNORMAL HIGH (ref 70–99)
Potassium: 4.1 mmol/L (ref 3.5–5.1)
Sodium: 138 mmol/L (ref 135–145)

## 2022-06-23 LAB — MAGNESIUM: Magnesium: 2.5 mg/dL — ABNORMAL HIGH (ref 1.7–2.4)

## 2022-06-23 NOTE — Plan of Care (Signed)
  Problem: Clinical Measurements: Goal: Ability to maintain clinical measurements within normal limits will improve Outcome: Progressing   Problem: Clinical Measurements: Goal: Will remain free from infection Outcome: Progressing   

## 2022-06-23 NOTE — Progress Notes (Signed)
Mobility Specialist - Progress Note   06/23/22 1100  Mobility  Activity Ambulated with assistance in hallway  Level of Assistance Standby assist, set-up cues, supervision of patient - no hands on  Assistive Device Front wheel walker  Distance Ambulated (ft) 80 ft  Activity Response Tolerated well  $Mobility charge 1 Mobility     Pre-mobility: 81 HR, 96% SpO2 During mobility: 86% SpO2 Post-mobility: 86 HR, 95%  SpO2   Pt lying in bed upon arrival, utilizing 2L. Pt able to complete bed mobility modI. STS and ambulated with close supervision. Fatigued with activity. Veers L initially but able to self-correct. Pt returned to bed with alarm set, needs in reach. RN notified.    Kathee Delton Mobility Specialist 06/23/22, 11:58 AM

## 2022-06-23 NOTE — Progress Notes (Signed)
CCMD notified me that the heart rate was in the 30's but recovered and is currently in the 80's. Patient did convert from sinus back into afib

## 2022-06-23 NOTE — Progress Notes (Addendum)
PROGRESS NOTE    Walter Moody  EXB:[id number] DOB: 1940-11-21 DOA: 06/20/2022 PCP: Jacklynn Barnacle, MD    Brief Narrative:  81 y.o. 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  He has been coughing but denies fever or chills.  Denies chest pain. ED course and data review: Vitals within normal limits except for mild tachypnea of 22-25.  Labs with troponin 35 and BNP 4 7, WBC 5.6 with lactic acid 0.8 and procalcitonin pending.  Hemoglobin 8.3 which is his baseline and creatinine 1.99 which is at baseline  Status post ultrasound-guided thoracentesis on 9/6.  1.7 L fluid liberated.  Sent to lab for fluid studies and cytology.  Small ex vacuo pneumothorax identified.  Patient relatively asymptomatic.  9/7: Kidney function relatively stable.  Patient net negative.  Repeat chest x-ray this morning shows persistent left-sided pneumothorax without radiographic signs of tension.  Interventional radiology made aware.  We will place patient back on oxygen.  9/8: Patient remained stable overnight.  This morning repeat chest x-ray demonstrates persistent left-sided pneumothorax.  No tension component identified.  Assessment & Plan:   Principal Problem:   Acute on chronic diastolic CHF (congestive heart failure) (HCC) Active Problems:   Pleural effusion on left   Acute on chronic  dyspnea   Multiple pulmonary nodules   CAD with hx of CABG   COPD (chronic obstructive pulmonary disease) with emphysema (HCC)   History of AAA (abdominal aortic aneurysm) repair   Stage 3b chronic kidney disease (HCC)   Chronic respiratory failure with hypoxia (HCC)   History of stroke   Elevated troponin   Dementia without behavioral disturbance (HCC)   Chronic back pain   Respiratory failure with hypoxia (HCC)   S/P thoracentesis  * Acute on chronic diastolic CHF (congestive heart failure) ([place]) Patient presents with shortness of breath, dyspnea on exertion lower extremity edema, pleural effusion, elevated troponin of 35 which could be demand ischemia as well as BNP elevated at 409 TTE, EF 50% with indeterminate diastolic function Kidney function stable Patient net negative Appears to be euvolemic Plan: Continue p.o. Lasix 40 mg twice daily Cardiology follow-up   Pleural effusion on left IR consulted for thoracentesis to help determine etiology of pleural effusion CT chest showing moderate-sized left pleural effusion some of which is loculated suggesting parapneumonic effusion or pleural metastatic disease -Status post ultrasound-guided thoracentesis 9/6.  1.7 L fluid liberated -Left-sided pneumothorax persistent on 9/7 -Cytology negative -Per lights criteria effusion is transudative Plan: Monitor  Left-sided pneumothorax Ex vacuo status post thoracentesis Slightly increased in size on 9/7 Interventional radiology made aware Persistent pneumothorax on 9/8 Plan: Use oxygen via nasal cannula Repeat chest x-ray in a.m.    Acute on chronic dyspnea Multifactorial related to pleural effusion, CHF with exacerbation, possible malignancy based on CT findings with chronic physical deconditioning.  Lower suspicion for infectious process and lower suspicion for PE O2 requirement  is at baseline so continue home O2 Treat primary suspected etiologies of CHF and pleural effusion of  undetermined etiology as outlined in note Suspect the patient is regaining his previous level of functioning   CAD with hx of CABG Elevated troponin Troponin of 35 but without complaints of chest pain and with nonacute EKG Elevated troponin likely related to demand ischemia from ongoing dyspnea Continue aspirin, atorvastatin   Multiple pulmonary nodules Patient has history of multiple stable pulmonary nodules per review of PCPs note. Current CT was not compared to prior stating none was available Current CT showing multiple nodules somewhat spiculated margins Suggest reread with comparison to prior, otherwise recommendation for follow-up PET CT and tissue sampling is recommended by radiologist Cytology from thoracentesis negative   Chronic back pain Physical deconditioning and ambulatory dysfunction At baseline ambulates with a walker Hydrocodone for pain PT/OT TOC consults Home with home health   Dementia without behavioral disturbance ([place]) Delirium precautions   History of stroke Continue aspirin and statin   Stage 3b chronic kidney disease ([place]) Renal function at baseline   History of AAA (abdominal aortic aneurysm) repair S/p celiac artery stenting Common iliac artery aneurysm PAD Continue aspirin and statin   COPD (chronic obstructive pulmonary disease) with emphysema ([place]) Chronic respiratory failure with hypoxia Not acutely exacerbated Continue home inhaler DuoNebs as needed Patient's O2 requirement is at baseline Continue supplemental oxygen   DVT prophylaxis: SQ Lovenox Code Status: DNR Family Communication: Daughter [name] [phone] on 9/6, wife on 9/7, 9/8 Disposition Plan: Status is: Inpatient Remains inpatient appropriate because: Heart failure and COPD at baseline.  Discharge pending resolution of left-sided postprocedural pneumothorax.   Level of care: Med-Surg  Consultants:  Cardiology-[facility] Interventional  radiology  Procedures:  Ultrasound-guided thoracentesis 9/6  Antimicrobials: None   Subjective: Patient seen and examined.  Resting comfortably in bed.  Shortness of breath improved.  No complaints.  Objective: Vitals:   06/22/22 1929 06/23/22 0422 06/23/22 0500 06/23/22 0738  BP: (!) 142/56 (!) 127/41  132/74  Pulse: 78 60  83  Resp: '20 20  15  '$ Temp: 98.4 F (36.9 C) 97.7 F (36.5 C)  97.6 F (36.4 C)  TempSrc: Oral Oral    SpO2: 93% 100%  (!) 89%  Weight:   81.5 kg   Height:        Intake/Output Summary (Last 24 hours) at 06/23/2022 1037 Last data filed at 06/23/2022 0741 Gross per 24 hour  Intake --  Output 1250 ml  Net -1250 ml   Filed Weights   06/21/22 0441 06/22/22 0404 06/23/22 0500  Weight: 81.6 kg 81.4 kg 81.5 kg    Examination:  General exam: No acute distress Respiratory system: Clear lungs.  Normal work of breathing.  2 L Cardiovascular system: S1-S2, RRR, no murmurs, no pedal edema Gastrointestinal system: Soft, NT/ND, normal bowel sounds Central nervous system: Alert, oriented x2, no focal deficits Extremities: Symmetric 5 x 5 power. Skin: No rashes, lesions or ulcers Psychiatry: Judgement and insight appear normal. Mood & affect appropriate.     Data Reviewed: I have personally reviewed following labs and imaging studies  CBC: Recent Labs  Lab 06/20/22 1136 06/21/22 1145 06/22/22 0838  WBC 5.6 5.8 6.7  NEUTROABS 4.1 4.2 4.8  HGB 8.3* 7.6* 8.5*  HCT 28.2* 25.1* 27.6*  MCV 94.9 94.4 92.0  PLT 200 186 209   Basic Metabolic Panel: Recent Labs  Lab 06/20/22 1136 06/21/22 1145 06/22/22 0838 06/23/22 0459  NA 141 140 139  138  K 3.6 3.8 3.8 4.1  CL 103 102 100 101  CO2 '30 30 29 28  '$ GLUCOSE 135* 172* 111* 126*  BUN 36* 35* 35* 42*  CREATININE 1.99* 2.03* 2.01* 2.12*  CALCIUM 9.2 8.9 8.8* 8.8*  MG 2.5*  --  2.4 2.5*   GFR: Estimated Creatinine Clearance: 30 mL/min (A) (by C-G formula based on SCr of 2.12 mg/dL (H)). Liver  Function Tests: Recent Labs  Lab 06/20/22 1136  AST 17  ALT 14  ALKPHOS 67  BILITOT 0.7  PROT 7.4  ALBUMIN 4.3   No results for input(s): "LIPASE", "AMYLASE" in the last 168 hours. No results for input(s): "AMMONIA" in the last 168 hours. Coagulation Profile: No results for input(s): "INR", "PROTIME" in the last 168 hours. Cardiac Enzymes: No results for input(s): "CKTOTAL", "CKMB", "CKMBINDEX", "TROPONINI" in the last 168 hours. BNP (last 3 results) No results for input(s): "PROBNP" in the last 8760 hours. HbA1C: No results for input(s): "HGBA1C" in the last 72 hours. CBG: No results for input(s): "GLUCAP" in the last 168 hours. Lipid Profile: No results for input(s): "CHOL", "HDL", "LDLCALC", "TRIG", "CHOLHDL", "LDLDIRECT" in the last 72 hours. Thyroid Function Tests: No results for input(s): "TSH", "T4TOTAL", "FREET4", "T3FREE", "THYROIDAB" in the last 72 hours. Anemia Panel: No results for input(s): "VITAMINB12", "FOLATE", "FERRITIN", "TIBC", "IRON", "RETICCTPCT" in the last 72 hours. Sepsis Labs: Recent Labs  Lab 06/20/22 1538 06/20/22 2017  PROCALCITON <0.10  --   LATICACIDVEN  --  0.8    Recent Results (from the past 240 hour(s))  Resp Panel by RT-PCR (Flu A&B, Covid) Anterior Nasal Swab     Status: None   Collection Time: 06/20/22  4:26 PM   Specimen: Anterior Nasal Swab  Result Value Ref Range Status   SARS Coronavirus 2 by RT PCR NEGATIVE NEGATIVE Final    Comment: (NOTE) SARS-CoV-2 target nucleic acids are NOT DETECTED.  The SARS-CoV-2 RNA is generally detectable in upper respiratory specimens during the acute phase of infection. The lowest concentration of SARS-CoV-2 viral copies this assay can detect is 138 copies/mL. A negative result does not preclude SARS-Cov-2 infection and should not be used as the sole basis for treatment or other patient management decisions. A negative result may occur with  improper specimen collection/handling, submission of  specimen other than nasopharyngeal swab, presence of viral mutation(s) within the areas targeted by this assay, and inadequate number of viral copies(<138 copies/mL). A negative result must be combined with clinical observations, patient history, and epidemiological information. The expected result is Negative.  Fact Sheet for Patients:  EntrepreneurPulse.com.au  Fact Sheet for Healthcare Providers:  IncredibleEmployment.be  This test is no t yet approved or cleared by the [place] FDA and  has been authorized for detection and/or diagnosis of SARS-CoV-2 by FDA under an Emergency Use Authorization (EUA). This EUA will remain  in effect (meaning this test can be used) for the duration of the COVID-19 declaration under Section 564(b)(1) of the Act, 21 U.S.C.section 360bbb-3(b)(1), unless the authorization is terminated  or revoked sooner.       Influenza A by PCR NEGATIVE NEGATIVE Final   Influenza B by PCR NEGATIVE NEGATIVE Final    Comment: (NOTE) The Xpert Xpress SARS-CoV-2/FLU/RSV plus assay is intended as an aid in the diagnosis of influenza from Nasopharyngeal swab specimens and should not be used as a sole basis for treatment. Nasal washings and aspirates are unacceptable for Xpert Xpress SARS-CoV-2/FLU/RSV testing.  Fact Sheet for Patients: EntrepreneurPulse.com.au  Fact Sheet for Healthcare Providers: IncredibleEmployment.be  This test is not yet approved or cleared by the [place] FDA and has been authorized for detection and/or diagnosis of SARS-CoV-2 by FDA under an Emergency Use Authorization (EUA). This EUA will remain in effect (meaning this test can be used) for the duration of the COVID-19 declaration under Section 564(b)(1) of the Act, 21 U.S.C. section 360bbb-3(b)(1), unless the authorization is terminated or revoked.  Performed at [facility], [address] [place], [place] [address]   Blood culture (routine x 2)     Status: None (Preliminary result)   Collection Time: 06/20/22  8:17 PM   Specimen: BLOOD  Result Value Ref Range Status   Specimen Description BLOOD BLOOD LEFT FOREARM  Final   Special Requests   Final    BOTTLES DRAWN AEROBIC AND ANAEROBIC Blood Culture results may not be optimal due to an excessive volume of blood received in culture bottles   Culture   Final    NO GROWTH 3 DAYS Performed at [facility], [address] [place], [place] [address]    Report Status PENDING  Incomplete  Blood culture (routine x 2)     Status: None (Preliminary result)   Collection Time: 06/20/22  8:17 PM   Specimen: BLOOD  Result Value Ref Range Status   Specimen Description   Final    BLOOD BLOOD LEFT ARM Performed at [facility], [address] [place], [place] [address]    Special Requests   Final    BOTTLES DRAWN AEROBIC AND ANAEROBIC Blood Culture results may not be optimal due to an excessive volume of blood received in culture bottles Performed at [facility], [address] [place], [place] [address]    Culture  Setup Time   Final    GRAM POSITIVE COCCI AEROBIC BOTTLE ONLY CRITICAL RESULT CALLED TO, READ BACK BY AND VERIFIED WITH: [facility] MITCHELL PHARMD AT 1005 06/22/22. HNM    Culture   Final    GRAM POSITIVE COCCI IDENTIFICATION TO FOLLOW Performed at [facility], [place] [address] [place],  [address]    Report Status PENDING  Incomplete  Blood Culture ID Panel (Reflexed)     Status: Abnormal   Collection Time: 06/20/22  8:17 PM  Result Value Ref Range Status   Enterococcus faecalis NOT DETECTED NOT DETECTED Final   Enterococcus Faecium NOT DETECTED NOT DETECTED Final   Listeria monocytogenes NOT DETECTED NOT DETECTED Final   Staphylococcus species DETECTED (A) NOT DETECTED Final    Comment: CRITICAL RESULT CALLED TO, READ BACK BY AND VERIFIED WITH: DEVAN MITCHELL PHARMD 1005  06/22/22 HNM    Staphylococcus aureus (BCID) NOT DETECTED NOT DETECTED Final   Staphylococcus epidermidis NOT DETECTED NOT DETECTED Final   Staphylococcus lugdunensis NOT DETECTED NOT DETECTED Final   Streptococcus species NOT DETECTED NOT DETECTED Final   Streptococcus agalactiae NOT DETECTED NOT DETECTED Final   Streptococcus pneumoniae NOT DETECTED NOT DETECTED Final   Streptococcus pyogenes NOT DETECTED NOT DETECTED Final   A.calcoaceticus-baumannii NOT DETECTED NOT DETECTED Final   Bacteroides fragilis NOT DETECTED NOT DETECTED Final   Enterobacterales NOT DETECTED NOT DETECTED Final   Enterobacter cloacae complex NOT DETECTED NOT DETECTED Final   Escherichia coli NOT DETECTED NOT DETECTED Final   Klebsiella aerogenes NOT DETECTED NOT DETECTED Final   Klebsiella oxytoca NOT DETECTED NOT DETECTED Final   Klebsiella pneumoniae NOT DETECTED NOT DETECTED Final   Proteus species NOT DETECTED NOT  DETECTED Final   Salmonella species NOT DETECTED NOT DETECTED Final   Serratia marcescens NOT DETECTED NOT DETECTED Final   Haemophilus influenzae NOT DETECTED NOT DETECTED Final   Neisseria meningitidis NOT DETECTED NOT DETECTED Final   Pseudomonas aeruginosa NOT DETECTED NOT DETECTED Final   Stenotrophomonas maltophilia NOT DETECTED NOT DETECTED Final   Candida albicans NOT DETECTED NOT DETECTED Final   Candida auris NOT DETECTED NOT DETECTED Final   Candida glabrata NOT DETECTED NOT DETECTED Final   Candida krusei NOT DETECTED NOT DETECTED Final   Candida parapsilosis NOT DETECTED NOT DETECTED Final   Candida tropicalis NOT DETECTED NOT DETECTED Final   Cryptococcus neoformans/gattii NOT DETECTED NOT DETECTED Final    Comment: Performed at [facility], [place], [place] [address]  Acid Fast Smear (AFB)     Status: None   Collection Time: 06/21/22 10:25 AM   Specimen: PATH Cytology Pleural fluid  Result Value Ref Range Status   AFB Specimen Processing Comment   Final    Comment: Tissue Grinding and Digestion/Decontamination   Acid Fast Smear Negative  Final    Comment: (NOTE) Performed At: [facility] [address] [place], [place] [id number] Rush Farmer MD VZ:[id number]    Source (AFB) PLEURAL  Final    Comment: Performed at [facility], [address] [place], [place] [address]  Body fluid culture w Gram Stain     Status: None (Preliminary result)   Collection Time: 06/21/22 10:25 AM   Specimen: PATH Cytology Pleural fluid  Result Value Ref Range Status   Specimen Description   Final    PLEURAL Performed at [facility], [address] [place], [place] [address]    Special Requests   Final    NONE Performed at [facility], [address] [place], [place] [address]    Gram Stain NO WBC SEEN NO ORGANISMS SEEN   Final   Culture   Final    NO GROWTH < 24 HOURS Performed at [facility], [address] [place], [place] [address]    Report Status PENDING  Incomplete         Radiology Studies: DG Chest Port 1 View  Result Date: 06/23/2022 CLINICAL DATA:  Pneumothorax following prior ultrasound-guided thoracentesis. EXAM: PORTABLE CHEST 1 VIEW COMPARISON:  AP chest 06/22/2022, 06/21/2022 (2 studies); CT chest 06/20/2022 FINDINGS: Status post median sternotomy and CABG. Cardiac silhouette is again mildly enlarged. Mild calcification within the aortic arch. Moderate left pneumothorax appears not significant changed from most recent 06/22/2022 frontal radiograph. No mediastinal shift to suggest a tension component. There is again moderate left interstitial thickening. More homogeneous left lung base opacification is unchanged. Mild right costophrenic angle linear scarring is unchanged from multiple prior radiographs. Mild-to-moderate degenerative disc and endplate changes of the thoracic spine. IMPRESSION: Unchanged moderate left pneumothorax. Again no tension component is seen.  Electronically Signed   By: Yvonne Kendall M.D.   On: 06/23/2022 08:25   DG Chest Port 1 View  Result Date: 06/22/2022 CLINICAL DATA:  Follow-up pneumothorax EXAM: PORTABLE CHEST 1 VIEW COMPARISON:  06/21/2022 FINDINGS: Stable cardiomediastinal contours status post sternotomy and CABG. Moderate-sized left-sided pneumothorax has slightly increased in size from prior. No abnormal shift of the heart or mediastinal structures to suggest a tension component. Slightly increasing opacities within the left lung. Minimal right basilar atelectasis. No right-sided pneumothorax. IMPRESSION: Moderate-sized left-sided pneumothorax has slightly increased in size from prior. No evidence of a tension component.  Electronically Signed   By: Davina Poke D.O.   On: 06/22/2022 09:27   ECHOCARDIOGRAM COMPLETE  Result Date: 06/21/2022    ECHOCARDIOGRAM REPORT   Patient Name:   JAGGAR BENKO [facility] Date of Exam: 06/21/2022 Medical Rec #:  [id number]            Height:       72.0 in Accession #:    [id number]           Weight:       179.9 lb Date of Birth:  November 21, 1940             BSA:          2.037 m Patient Age:    12 years             BP:           125/56 mmHg Patient Gender: M                    HR:           95 bpm. Exam Location:  [facility] Procedure: 2D Echo, Cardiac Doppler and Color Doppler Indications:     CHF-acute diastolic G38.75  History:         Patient has no prior history of Echocardiogram examinations.                  CHF, COPD and Stroke; Risk Factors:Hypertension. Heart attack.  Sonographer:     [name] Referring Phys:  [id number] Athena Masse Diagnosing Phys: Kate Sable MD  Sonographer Comments: Technically challenging study due to limited acoustic windows and no apical window. Image acquisition challenging due to COPD. IMPRESSIONS  1. Left ventricular ejection fraction, by estimation, is 50%. The left ventricle has low normal function. The left ventricle has no regional wall motion abnormalities. The left  ventricular internal cavity size was mildly dilated. Left ventricular diastolic function could not be evaluated.  2. Right ventricular systolic function is low normal. The right ventricular size is normal.  3. Left atrial size was mildly dilated.  4. The mitral valve was not well visualized. No evidence of mitral valve regurgitation.  5. The aortic valve was not well visualized. Aortic valve regurgitation is not visualized. Aortic valve sclerosis/calcification is present, without any evidence of aortic stenosis.  6. The inferior vena cava is normal in size with greater than 50% respiratory variability, suggesting right atrial pressure of 3 mmHg. FINDINGS  Left Ventricle: Left ventricular ejection fraction, by estimation, is 50%. The left ventricle has low normal function. The left ventricle has no regional wall motion abnormalities. The left ventricular internal cavity size was mildly dilated. There is no left ventricular hypertrophy. Left ventricular diastolic function could not be evaluated. Right Ventricle: The right ventricular size is normal. No increase in right ventricular wall thickness. Right ventricular systolic function is low normal. Left Atrium: Left atrial size was mildly dilated. Right Atrium: Right atrial size was normal in size. Pericardium: There is no evidence of pericardial effusion. Mitral Valve: The mitral valve was not well visualized. No evidence of mitral valve regurgitation. Tricuspid Valve: The tricuspid valve is normal in structure. Tricuspid valve regurgitation is not demonstrated. Aortic Valve: The aortic valve was not well visualized. Aortic valve regurgitation is not visualized. Aortic valve sclerosis/calcification is present, without any evidence of aortic stenosis. Pulmonic Valve: The pulmonic valve was not well visualized. Pulmonic valve regurgitation is not visualized. Aorta: The aortic root is normal in size and  structure. Venous: The inferior vena cava is normal in size with  greater than 50% respiratory variability, suggesting right atrial pressure of 3 mmHg. IAS/Shunts: No atrial level shunt detected by color flow Doppler.  LEFT VENTRICLE PLAX 2D LVIDd:         6.10 cm LVIDs:         4.40 cm LV PW:         0.90 cm LV IVS:        0.80 cm LVOT diam:     2.10 cm LVOT Area:     3.46 cm  LEFT ATRIUM         Index LA diam:    5.30 cm 2.60 cm/m   AORTA Ao Root diam: 2.80 cm  SHUNTS Systemic Diam: 2.10 cm Kate Sable MD Electronically signed by Kate Sable MD Signature Date/Time: 06/21/2022/4:14:53 PM    Final    DG Chest Port 1 View  Result Date: 06/21/2022 CLINICAL DATA:  Follow-up pneumothorax ex vacuo EXAM: PORTABLE CHEST 1 VIEW COMPARISON:  Earlier same day FINDINGS: Stable appearance of the cardiomediastinal silhouette status post median sternotomy, with similar enlargement of the heart. No pleural effusion. Improving apical component of small left pneumothorax. Similar to improved basilar component of suspected ex vacuo pneumothorax. No lobar consolidation. IMPRESSION: Similar to improving small left pneumothorax with predominant ex vacuo component. Electronically Signed   By: Albin Felling M.D.   On: 06/21/2022 15:27   DG Chest Port 1 View  Result Date: 06/21/2022 CLINICAL DATA:  Status post thoracentesis on the left. EXAM: PORTABLE CHEST 1 VIEW COMPARISON:  CT and radiographs 06/20/2022. FINDINGS: 1039 hours. Interval near complete evacuation of the previously demonstrated left-sided pleural effusion. There is a small left-sided pneumothorax with apical and basilar components, partially ex vacuo. No resulting mediastinal shift. There is improved aeration of the left lung with mild residual left basilar atelectasis. The right lung is clear. The heart size and mediastinal contours are stable post median sternotomy. Previous cervical fusion noted. IMPRESSION: Small left-sided pneumothorax post thoracentesis, predominately ex vacuo. No tension component identified.  Critical Value/emergent results were communicated at the time of interpretation on 06/21/2022 at 11:01 am to provider [facility] , who acknowledged these results. Electronically Signed   By: Richardean Sale M.D.   On: 06/21/2022 11:02        Scheduled Meds:  aspirin EC  81 mg Oral Daily   atorvastatin  40 mg Oral Daily   enoxaparin (LOVENOX) injection  40 mg Subcutaneous Q24H   FLUoxetine  20 mg Oral Daily   furosemide  40 mg Oral BID   mometasone-formoterol  2 puff Inhalation BID   polyethylene glycol  17 g Oral Daily   senna-docusate  1 tablet Oral Daily   tamsulosin  0.8 mg Oral Daily   traZODone  25 mg Oral QHS   Continuous Infusions:   LOS: 2 days     Sidney Ace, MD [facility]   If 7PM-7AM, please contact night-coverage  06/23/2022, 10:37 AM

## 2022-06-24 ENCOUNTER — Inpatient Hospital Stay: Payer: Medicare Other

## 2022-06-24 DIAGNOSIS — I5033 Acute on chronic diastolic (congestive) heart failure: Secondary | ICD-10-CM | POA: Diagnosis not present

## 2022-06-24 LAB — CULTURE, BLOOD (ROUTINE X 2)

## 2022-06-24 LAB — BASIC METABOLIC PANEL
Anion gap: 10 (ref 5–15)
BUN: 50 mg/dL — ABNORMAL HIGH (ref 8–23)
CO2: 27 mmol/L (ref 22–32)
Calcium: 8.8 mg/dL — ABNORMAL LOW (ref 8.9–10.3)
Chloride: 100 mmol/L (ref 98–111)
Creatinine, Ser: 2.16 mg/dL — ABNORMAL HIGH (ref 0.61–1.24)
GFR, Estimated: 30 mL/min — ABNORMAL LOW (ref >60)
Glucose, Bld: 131 mg/dL — ABNORMAL HIGH (ref 70–99)
Potassium: 4 mmol/L (ref 3.5–5.1)
Sodium: 137 mmol/L (ref 135–145)

## 2022-06-24 LAB — MAGNESIUM: Magnesium: 2.5 mg/dL — ABNORMAL HIGH (ref 1.7–2.4)

## 2022-06-24 MED ORDER — ENOXAPARIN SODIUM 30 MG/0.3ML IJ SOSY
30.0000 mg | PREFILLED_SYRINGE | Freq: Every day | INTRAMUSCULAR | Status: DC
  Administered 2022-06-24: 30 mg via SUBCUTANEOUS
  Filled 2022-06-24: qty 0.3

## 2022-06-24 NOTE — Discharge Summary (Signed)
Physician Discharge Summary  Walter Moody VQM:[id number] DOB: 03-10-1941 DOA: 06/20/2022  PCP: Jacklynn Barnacle, MD  Admit date: 06/20/2022 Discharge date: 06/24/2022  Admitted From: Home Disposition:  Home  Recommendations for Outpatient Follow-up:  Follow up with PCP in 1-2 weeks FU with cardiology post [facility]:No (recommended, patient declined)  Equipment/Devices:Oxygen 2L via [place]   Discharge Condition:Stable  CODE STATUS:DNR  Diet recommendation: Heart healthy  Brief/Interim Summary:  on home O2 at 2 L, CAD s/p CABG, history of left PCA infarct 2021, BPH, PAD, AAA s/p endovascular repair, left common iliac artery aneurysm, s/p celiac artery stenting, diastolic CHF, chronic back pain, CKD 3 B, stable pulmonary nodules, cognitive dysfunction, chronic anemia, most recently seen by his PCP on 7/12 at which time he had lower extremity edema and dyspnea  but with complaints of functional decline, who presents to the ED with a several week history of shortness of breath, increasing dyspnea on exertion, decreased ability to ambulate with his walker and ongoing back pain.  He has been coughing but denies fever or chills.  Denies chest pain. ED course and data review: Vitals within normal limits except for mild tachypnea of 22-25.  Labs with troponin 35 and BNP 4 7, WBC 5.6 with lactic acid 0.8 and procalcitonin pending.  Hemoglobin 8.3 which is his baseline and creatinine 1.99 which is at baseline   Status post ultrasound-guided thoracentesis on 9/6.  1.7 L fluid liberated.  Sent to lab for fluid studies and cytology.  Small ex vacuo pneumothorax identified.  Patient relatively asymptomatic.   9/7: Kidney function relatively stable.  Patient net negative.  Repeat chest x-ray this morning shows persistent left-sided pneumothorax without radiographic signs of tension.  Interventional radiology made aware.  We will place patient back on oxygen.   9/8: Patient remained stable  overnight.  This morning repeat chest x-ray demonstrates persistent left-sided pneumothorax.  No tension component identified.  9/9: Persistent left-sided pneumothorax noted.  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  Likely present for extended period of time.  Lung unlikely to expand beyond this point.  Respiratory status stable, on baseline 2 L oxygen.  Stable for discharge at this time.  Home health services ordered.  Patient has declined.  Will discharge home on previous dose of Lasix 40 mg p.o. twice daily.  Follow-up outpatient PCP and cardiology.   Discharge Diagnoses:  Principal Problem:   Acute on chronic diastolic CHF (congestive heart failure) (HCC) Active Problems:   Pleural effusion on left   Acute on chronic dyspnea   Multiple pulmonary nodules   CAD with hx of CABG   COPD (chronic obstructive pulmonary disease) with emphysema (HCC)   History of AAA (abdominal aortic aneurysm) repair   Stage 3b chronic kidney disease (HCC)   Chronic respiratory failure with hypoxia (HCC)   History of stroke   Elevated troponin   Dementia without behavioral disturbance (HCC)   Chronic back pain   Respiratory failure with hypoxia (HCC)   S/P thoracentesis  Acute on chronic diastolic CHF (congestive heart failure) ([place]) Patient presents with shortness of breath, dyspnea on exertion lower extremity edema, pleural effusion, elevated troponin of 35 which could be demand ischemia as well as BNP elevated at 409 TTE, EF 50% with indeterminate diastolic function Kidney function stable Patient net negative Appears to be euvolemic Plan: Continue p.o. Lasix 40 mg twice daily outpatient cardiology follow-up   Pleural effusion on left IR consulted for thoracentesis to help determine etiology of pleural effusion CT  chest showing moderate-sized left pleural effusion some of which is loculated suggesting parapneumonic effusion or pleural metastatic disease -Status post ultrasound-guided thoracentesis 9/6.  1.7 L  fluid liberated -Left-sided pneumothorax persistent on 9/7 -Cytology negative -Per lights criteria effusion is transudative Plan: Pneumothorax persistent on left side.  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  On unlikely to completely resolve.  Patient will always be at increased risk of development of pleural effusions.  Do not recommend repeat thoracentesis unless patient is markedly symptomatic or there is another indication.   Left-sided pneumothorax Ex vacuo status post thoracentesis Slightly increased in size on 9/7 Interventional radiology made aware Persistent pneumothorax on 9/8 Plan: Pneumothorax persistent on left side.  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  On unlikely to completely resolve.  Patient will always be at increased risk of development of pleural effusions.  Do not recommend repeat thoracentesis unless patient is markedly symptomatic or there is another indication. Can resume home nasal cannula and diuretics as above     Acute on chronic dyspnea Multifactorial related to pleural effusion, CHF with exacerbation, possible malignancy based on CT findings with chronic physical deconditioning.  Lower suspicion for infectious process and lower suspicion for PE O2 requirement is at baseline so continue home O2 Treat primary suspected etiologies of CHF and pleural effusion of undetermined etiology as outlined in note Suspect the patient is regaining his previous level of functioning Appears at baseline at time of discharge   CAD with hx of CABG Elevated troponin Troponin of 35 but without complaints of chest pain and with nonacute EKG Elevated troponin likely related to demand ischemia from ongoing dyspnea Continue aspirin, atorvastatin   Multiple pulmonary nodules Patient has history of multiple stable pulmonary nodules per review of PCPs note. Current CT was not compared to prior stating none was available Current CT showing multiple nodules somewhat  spiculated margins Suggest reread with comparison to prior, otherwise recommendation for follow-up PET CT and tissue sampling is recommended by radiologist Cytology from thoracentesis negative Outpatient PCP follow-up   Chronic back pain Physical deconditioning and ambulatory dysfunction At baseline ambulates with a walker Hydrocodone for pain PT/OT TOC consults Home with home health (patient declined home health services)   Dementia without behavioral disturbance ([place]) Delirium precautions   History of stroke Continue aspirin and statin   Stage 3b chronic kidney disease ([place]) Renal function at baseline   History of AAA (abdominal aortic aneurysm) repair S/p celiac artery stenting Common iliac artery aneurysm PAD Continue aspirin and statin   COPD (chronic obstructive pulmonary disease) with emphysema (HCC) Chronic respiratory failure with hypoxia Not acutely exacerbated Can resume home bronchodilator and oxygen regimen   Discharge Instructions  Discharge Instructions     Diet - low sodium heart healthy   Complete by: As directed    Increase activity slowly   Complete by: As directed       Allergies as of 06/24/2022   No Known Allergies      Medication List     TAKE these medications    acetaminophen 500 MG tablet Commonly known as: TYLENOL Take 500-1,000 mg by mouth every 6 (six) hours as needed for mild pain or moderate pain.   albuterol 108 (90 Base) MCG/ACT inhaler Commonly known as: VENTOLIN HFA Inhale 1-2 puffs into the lungs every 6 (six) hours as needed for wheezing or shortness of breath.   albuterol (2.5 MG/3ML) 0.083% nebulizer solution Commonly known as: PROVENTIL Take 2.5 mg by nebulization 4 (four)  times daily.   aspirin EC 81 MG tablet Take 81 mg by mouth daily.   atorvastatin 40 MG tablet Commonly known as: LIPITOR Take 40 mg by mouth daily.   FLUoxetine 20 MG capsule Commonly known as: PROZAC Take 20 mg by mouth daily.    fluticasone-salmeterol 250-50 MCG/ACT Aepb Commonly known as: ADVAIR Inhale 1 puff into the lungs in the morning and at bedtime.   furosemide 20 MG tablet Commonly known as: LASIX Take 40 mg by mouth 2 (two) times daily.   ipratropium 0.02 % nebulizer solution Commonly known as: ATROVENT Inhale 0.5 mg into the lungs 3 (three) times daily.   senna-docusate 8.6-50 MG tablet Commonly known as: Senokot-S Take 1 tablet by mouth daily.   tamsulosin 0.4 MG Caps capsule Commonly known as: FLOMAX Take 0.8 mg by mouth daily.   traZODone 50 MG tablet Commonly known as: DESYREL Take 25 mg by mouth at bedtime.        No Known Allergies  Consultations: Cardiology IR   Procedures/Studies: DG Chest Port 1 View  Result Date: 06/24/2022 CLINICAL DATA:  [id number].  Evaluate left hydropneumothorax, follow-up. EXAM: PORTABLE CHEST 1 VIEW COMPARISON:  Portable chest yesterday at 5:50 a.m. FINDINGS: 5:22 a.m. Left hydropneumothorax is again noted, stable moderate layering hydro component, pneumo component with near circumferential pneumothorax again estimated 30-40% chest volume. There has been no significant interval change. There is no mediastinal shift. There is mild cardiomegaly with intact sternotomy sutures, CABG change. No vascular congestion is seen. Aortic atherosclerosis and uncoiling are unchanged. Heterogeneous consolidation/atelectasis in the periphery and base of the partially collapsed left lung appears similar. Right lung is emphysematous and generally clear with trace right pleural fluid. No new abnormality. Osteopenia. IMPRESSION: Stable portable radiographic appearance of left hydropneumothorax with 30-40% pneumo component, moderate layering hydro component. No mediastinal shift. No new abnormality. Electronically Signed   By: Telford Nab M.D.   On: 06/24/2022 07:36   DG Chest Port 1 View  Result Date: 06/23/2022 CLINICAL DATA:  Pneumothorax following prior ultrasound-guided  thoracentesis. EXAM: PORTABLE CHEST 1 VIEW COMPARISON:  AP chest 06/22/2022, 06/21/2022 (2 studies); CT chest 06/20/2022 FINDINGS: Status post median sternotomy and CABG. Cardiac silhouette is again mildly enlarged. Mild calcification within the aortic arch. Moderate left pneumothorax appears not significant changed from most recent 06/22/2022 frontal radiograph. No mediastinal shift to suggest a tension component. There is again moderate left interstitial thickening. More homogeneous left lung base opacification is unchanged. Mild right costophrenic angle linear scarring is unchanged from multiple prior radiographs. Mild-to-moderate degenerative disc and endplate changes of the thoracic spine. IMPRESSION: Unchanged moderate left pneumothorax. Again no tension component is seen. Electronically Signed   By: Yvonne Kendall M.D.   On: 06/23/2022 08:25   DG Chest Port 1 View  Result Date: 06/22/2022 CLINICAL DATA:  Follow-up pneumothorax EXAM: PORTABLE CHEST 1 VIEW COMPARISON:  06/21/2022 FINDINGS: Stable cardiomediastinal contours status post sternotomy and CABG. Moderate-sized left-sided pneumothorax has slightly increased in size from prior. No abnormal shift of the heart or mediastinal structures to suggest a tension component. Slightly increasing opacities within the left lung. Minimal right basilar atelectasis. No right-sided pneumothorax. IMPRESSION: Moderate-sized left-sided pneumothorax has slightly increased in size from prior. No evidence of a tension component. Electronically Signed   By: Davina Poke D.O.   On: 06/22/2022 09:27   ECHOCARDIOGRAM COMPLETE  Result Date: 06/21/2022    ECHOCARDIOGRAM REPORT   Patient Name:   YUNIS VOORHEIS [facility] Date of Exam: 06/21/2022 Medical Rec #:  [id number]            Height:       72.0 in Accession #:    [id number]           Weight:       179.9 lb Date of Birth:  08-Jun-1941             BSA:          2.037 m Patient Age:    66 years             BP:           125/56  mmHg Patient Gender: M                    HR:           95 bpm. Exam Location:  [facility] Procedure: 2D Echo, Cardiac Doppler and Color Doppler Indications:     CHF-acute diastolic I77.82  History:         Patient has no prior history of Echocardiogram examinations.                  CHF, COPD and Stroke; Risk Factors:Hypertension. Heart attack.  Sonographer:     [name] Referring Phys:  [id number] Athena Masse Diagnosing Phys: Kate Sable MD  Sonographer Comments: Technically challenging study due to limited acoustic windows and no apical window. Image acquisition challenging due to COPD. IMPRESSIONS  1. Left ventricular ejection fraction, by estimation, is 50%. The left ventricle has low normal function. The left ventricle has no regional wall motion abnormalities. The left ventricular internal cavity size was mildly dilated. Left ventricular diastolic function could not be evaluated.  2. Right ventricular systolic function is low normal. The right ventricular size is normal.  3. Left atrial size was mildly dilated.  4. The mitral valve was not well visualized. No evidence of mitral valve regurgitation.  5. The aortic valve was not well visualized. Aortic valve regurgitation is not visualized. Aortic valve sclerosis/calcification is present, without any evidence of aortic stenosis.  6. The inferior vena cava is normal in size with greater than 50% respiratory variability, suggesting right atrial pressure of 3 mmHg. FINDINGS  Left Ventricle: Left ventricular ejection fraction, by estimation, is 50%. The left ventricle has low normal function. The left ventricle has no regional wall motion abnormalities. The left ventricular internal cavity size was mildly dilated. There is no left ventricular hypertrophy. Left ventricular diastolic function could not be evaluated. Right Ventricle: The right ventricular size is normal. No increase in right ventricular wall thickness. Right ventricular systolic function is low  normal. Left Atrium: Left atrial size was mildly dilated. Right Atrium: Right atrial size was normal in size. Pericardium: There is no evidence of pericardial effusion. Mitral Valve: The mitral valve was not well visualized. No evidence of mitral valve regurgitation. Tricuspid Valve: The tricuspid valve is normal in structure. Tricuspid valve regurgitation is not demonstrated. Aortic Valve: The aortic valve was not well visualized. Aortic valve regurgitation is not visualized. Aortic valve sclerosis/calcification is present, without any evidence of aortic stenosis. Pulmonic Valve: The pulmonic valve was not well visualized. Pulmonic valve regurgitation is not visualized. Aorta: The aortic root is normal in size and structure. Venous: The inferior vena cava is normal in size with greater than 50% respiratory variability, suggesting right atrial pressure of 3 mmHg. IAS/Shunts: No atrial level shunt detected by color flow Doppler.  LEFT VENTRICLE PLAX 2D LVIDd:  6.10 cm LVIDs:         4.40 cm LV PW:         0.90 cm LV IVS:        0.80 cm LVOT diam:     2.10 cm LVOT Area:     3.46 cm  LEFT ATRIUM         Index LA diam:    5.30 cm 2.60 cm/m   AORTA Ao Root diam: 2.80 cm  SHUNTS Systemic Diam: 2.10 cm Kate Sable MD Electronically signed by Kate Sable MD Signature Date/Time: 06/21/2022/4:14:53 PM    Final    DG Chest Port 1 View  Result Date: 06/21/2022 CLINICAL DATA:  Follow-up pneumothorax ex vacuo EXAM: PORTABLE CHEST 1 VIEW COMPARISON:  Earlier same day FINDINGS: Stable appearance of the cardiomediastinal silhouette status post median sternotomy, with similar enlargement of the heart. No pleural effusion. Improving apical component of small left pneumothorax. Similar to improved basilar component of suspected ex vacuo pneumothorax. No lobar consolidation. IMPRESSION: Similar to improving small left pneumothorax with predominant ex vacuo component. Electronically Signed   By: Albin Felling M.D.    On: 06/21/2022 15:27   US THORACENTESIS ASP PLEURAL SPACE W/IMG GUIDE  Result Date: 06/21/2022 INDICATION: Patient presented to the hospital with cough and shortness of breath. Found to have a left pleural effusion. Interventional radiology requested to perform a therapeutic and diagnostic thoracentesis. EXAM: ULTRASOUND GUIDED THORACENTESIS MEDICATIONS: 1% lidocaine 15 mL COMPLICATIONS: SIR Level A - No therapy, no consequence. PROCEDURE: An ultrasound guided thoracentesis was thoroughly discussed with the patient and questions answered. The benefits, risks, alternatives and complications were also discussed. The patient understands and wishes to proceed with the procedure. Written consent was obtained. Ultrasound was performed to localize and mark an adequate pocket of fluid in the left chest. The area was then prepped and draped in the normal sterile fashion. 1% Lidocaine was used for local anesthesia. Under ultrasound guidance a 6 Fr Safe-T-Centesis catheter was introduced. Thoracentesis was performed. The catheter was removed and a dressing applied. FINDINGS: A total of approximately 1.7 L of amber-colored fluid was removed. Samples were sent to the laboratory as requested by the clinical team. IMPRESSION: Successful ultrasound guided left thoracentesis yielding 1.7 L of pleural fluid. Small ex vacuo pneumothorax seen on postprocedure chest x-ray. Repeat chest x-ray approximately 3 hours later shows the ex vacuo pneumothorax to be similar to slightly improved. Read by: Soyla Dryer, NP Electronically Signed   By: Albin Felling M.D.   On: 06/21/2022 15:24   DG Chest Port 1 View  Result Date: 06/21/2022 CLINICAL DATA:  Status post thoracentesis on the left. EXAM: PORTABLE CHEST 1 VIEW COMPARISON:  CT and radiographs 06/20/2022. FINDINGS: 1039 hours. Interval near complete evacuation of the previously demonstrated left-sided pleural effusion. There is a small left-sided pneumothorax with apical and  basilar components, partially ex vacuo. No resulting mediastinal shift. There is improved aeration of the left lung with mild residual left basilar atelectasis. The right lung is clear. The heart size and mediastinal contours are stable post median sternotomy. Previous cervical fusion noted. IMPRESSION: Small left-sided pneumothorax post thoracentesis, predominately ex vacuo. No tension component identified. Critical Value/emergent results were communicated at the time of interpretation on 06/21/2022 at 11:01 am to provider [facility] , who acknowledged these results. Electronically Signed   By: Richardean Sale M.D.   On: 06/21/2022 11:02   CT Chest Wo Contrast  Result Date: 06/20/2022 CLINICAL DATA:  Cough, shortness of  breath x2 weeks EXAM: CT CHEST WITHOUT CONTRAST TECHNIQUE: Multidetector CT imaging of the chest was performed following the standard protocol without IV contrast. RADIATION DOSE REDUCTION: This exam was performed according to the departmental dose-optimization program which includes automated exposure control, adjustment of the mA and/or kV according to patient size and/or use of iterative reconstruction technique. COMPARISON:  None Available. FINDINGS: Cardiovascular: Extensive coronary artery calcifications are seen. There is previous coronary artery bypass. Heart is enlarged in size. Mediastinum/Nodes: No significant lymphadenopathy is seen. Lungs/Pleura: Centrilobular and panlobular emphysema is seen. In image 65 of series 5, there is 1.5 x 1.3 cm nodule with spiculated margins in the anterolateral aspect of right upper lobe. There is moderate to large left pleural effusion part of which appears to be loculated in the lateral aspect of left lower lung field. There is no significant right pleural effusion. Linear infiltrates are seen in lingula. In image 112 of series 3, there is 2.8 x 1.7 cm pleural-based nodular infiltrate in left lower lobe. In image 109, there is 8 mm pleural-based  nodule in right lower lobe. Fibrotic changes are noted in right middle lobe, anterior left upper lobe and anterior right upper lobe. There is no pneumothorax. Upper Abdomen: There is previous endovascular stent repair in abdominal aorta. A stent is noted in proximal courses of celiac and SMA. There is 1.4 cm calculus in the upper pole of left kidney. Musculoskeletal: Decrease in height of lower endplate of body of L2 vertebra has not changed. There is mild decrease in height of upper endplate of body of L1 vertebra with no significant change. IMPRESSION: There is 2.8 cm pleural-based nodular density in left lower lobe. This may suggest atelectasis/pneumonia and possibly neoplastic process. There is a 1.5 cm nodule with spiculated margins in right upper lobe. There is 8 mm pleural-based nodule in right lower lobe. Possibility of malignant neoplastic process is not excluded. Follow-up PET-CT and tissue sampling as warranted should be considered. Moderate sized left pleural effusion some of which is loculated along the lateral aspect of left lower lung field. This may suggest parapneumonic effusion or pleural metastatic disease. Severe COPD.  No significant lymphadenopathy is seen in mediastinum. Extensive coronary artery disease with previous coronary bypass surgery. Severe atherosclerotic changes are noted in abdominal aorta and its major branches. There is a 1.4 cm left renal calculus. Other findings as described in the body of the report. Electronically Signed   By: Elmer Picker M.D.   On: 06/20/2022 17:36   DG Chest 2 View  Result Date: 06/20/2022 CLINICAL DATA:  Shortness of breath EXAM: CHEST - 2 VIEW COMPARISON:  None Available. FINDINGS: Cardiomegaly status post median sternotomy and CABG. Moderate left pleural effusion associated atelectasis or consolidation. Diffuse bilateral interstitial opacity. Underlying emphysema. Disc degenerative disease of the thoracic spine. IMPRESSION: 1. Moderate left  pleural effusion and associated atelectasis or consolidation. 2. Diffuse bilateral interstitial opacity, consistent with edema and/or infection. No focal airspace opacity. 3. Underlying emphysema. Electronically Signed   By: Delanna Ahmadi M.D.   On: 06/20/2022 12:15      Subjective: Seen and examined the day of discharge.  Stable no distress.  Stable for discharge home.  Discharge Exam: Vitals:   06/24/22 0559 06/24/22 0728  BP: 112/78 133/77  Pulse: 83 65  Resp: 16 18  Temp: 98.1 F (36.7 C) 98.3 F (36.8 C)  SpO2: 96% 99%   Vitals:   06/23/22 1920 06/24/22 0559 06/24/22 0600 06/24/22 0728  BP: (!) 143/62  112/78  133/77  Pulse: 79 83  65  Resp: '18 16  18  '$ Temp: 98.8 F (37.1 C) 98.1 F (36.7 C)  98.3 F (36.8 C)  TempSrc: Oral Oral  Oral  SpO2: 97% 96%  99%  Weight:   77 kg   Height:        General: Pt is alert, awake, not in acute distress Cardiovascular: RRR, S1/S2 +, no rubs, no gallops Respiratory: Diminished breath sounds.  Mild left-sided crackles.  Normal work of breathing.  2 L Abdominal: Soft, NT, ND, bowel sounds + Extremities: no edema, no cyanosis    The results of significant diagnostics from this hospitalization (including imaging, microbiology, ancillary and laboratory) are listed below for reference.     Microbiology: Recent Results (from the past 240 hour(s))  Resp Panel by RT-PCR (Flu A&B, Covid) Anterior Nasal Swab     Status: None   Collection Time: 06/20/22  4:26 PM   Specimen: Anterior Nasal Swab  Result Value Ref Range Status   SARS Coronavirus 2 by RT PCR NEGATIVE NEGATIVE Final    Comment: (NOTE) SARS-CoV-2 target nucleic acids are NOT DETECTED.  The SARS-CoV-2 RNA is generally detectable in upper respiratory specimens during the acute phase of infection. The lowest concentration of SARS-CoV-2 viral copies this assay can detect is 138 copies/mL. A negative result does not preclude SARS-Cov-2 infection and should not be used as the  sole basis for treatment or other patient management decisions. A negative result may occur with  improper specimen collection/handling, submission of specimen other than nasopharyngeal swab, presence of viral mutation(s) within the areas targeted by this assay, and inadequate number of viral copies(<138 copies/mL). A negative result must be combined with clinical observations, patient history, and epidemiological information. The expected result is Negative.  Fact Sheet for Patients:  EntrepreneurPulse.com.au  Fact Sheet for Healthcare Providers:  IncredibleEmployment.be  This test is no t yet approved or cleared by the [place] FDA and  has been authorized for detection and/or diagnosis of SARS-CoV-2 by FDA under an Emergency Use Authorization (EUA). This EUA will remain  in effect (meaning this test can be used) for the duration of the COVID-19 declaration under Section 564(b)(1) of the Act, 21 U.S.C.section 360bbb-3(b)(1), unless the authorization is terminated  or revoked sooner.       Influenza A by PCR NEGATIVE NEGATIVE Final   Influenza B by PCR NEGATIVE NEGATIVE Final    Comment: (NOTE) The Xpert Xpress SARS-CoV-2/FLU/RSV plus assay is intended as an aid in the diagnosis of influenza from Nasopharyngeal swab specimens and should not be used as a sole basis for treatment. Nasal washings and aspirates are unacceptable for Xpert Xpress SARS-CoV-2/FLU/RSV testing.  Fact Sheet for Patients: EntrepreneurPulse.com.au  Fact Sheet for Healthcare Providers: IncredibleEmployment.be  This test is not yet approved or cleared by the [place] FDA and has been authorized for detection and/or diagnosis of SARS-CoV-2 by FDA under an Emergency Use Authorization (EUA). This EUA will remain in effect (meaning this test can be used) for the duration of the COVID-19 declaration under Section 564(b)(1) of the  Act, 21 U.S.C. section 360bbb-3(b)(1), unless the authorization is terminated or revoked.  Performed at [facility], [address] [place], [place] [address]   Blood culture (routine x 2)     Status: None (Preliminary result)   Collection Time: 06/20/22  8:17 PM   Specimen: BLOOD  Result Value Ref Range Status   Specimen Description BLOOD BLOOD LEFT FOREARM  Final   Special Requests   Final    BOTTLES DRAWN AEROBIC AND ANAEROBIC Blood Culture results may not be optimal due to an excessive volume of blood received in culture bottles   Culture   Final    NO GROWTH 4 DAYS Performed at [facility], [address], [place], [place] [address]    Report Status PENDING  Incomplete  Blood culture (routine x 2)     Status: Abnormal   Collection Time: 06/20/22  8:17 PM   Specimen: BLOOD  Result Value Ref Range Status   Specimen Description   Final    BLOOD BLOOD LEFT ARM Performed at [facility], [address] [place], [place] [address]    Special Requests   Final    BOTTLES DRAWN AEROBIC AND ANAEROBIC Blood Culture results may not be optimal due to an excessive volume of blood received in culture bottles Performed at [facility], [address] [place], [place] [address]    Culture  Setup Time   Final    GRAM POSITIVE COCCI AEROBIC BOTTLE ONLY CRITICAL RESULT CALLED TO, READ BACK BY AND VERIFIED WITH: [facility] MITCHELL PHARMD AT 1005 06/22/22. HNM    Culture (A)  Final    STAPHYLOCOCCUS HOMINIS THE SIGNIFICANCE OF ISOLATING THIS ORGANISM FROM A SINGLE SET OF BLOOD CULTURES WHEN MULTIPLE SETS ARE DRAWN IS UNCERTAIN. PLEASE NOTIFY THE MICROBIOLOGY DEPARTMENT WITHIN ONE WEEK IF SPECIATION AND SENSITIVITIES ARE REQUIRED. Performed at [facility], [place] [address], [place], [place] [address]    Report Status 06/24/2022 FINAL  Final  Blood Culture ID Panel (Reflexed)     Status: Abnormal   Collection Time: 06/20/22  8:17 PM  Result Value  Ref Range Status   Enterococcus faecalis NOT DETECTED NOT DETECTED Final   Enterococcus Faecium NOT DETECTED NOT DETECTED Final   Listeria monocytogenes NOT DETECTED NOT DETECTED Final   Staphylococcus species DETECTED (A) NOT DETECTED Final    Comment: CRITICAL RESULT CALLED TO, READ BACK BY AND VERIFIED WITH: DEVAN MITCHELL PHARMD 1005 06/22/22 HNM    Staphylococcus aureus (BCID) NOT DETECTED NOT DETECTED Final   Staphylococcus epidermidis NOT DETECTED NOT DETECTED Final   Staphylococcus lugdunensis NOT DETECTED NOT DETECTED Final   Streptococcus species NOT DETECTED NOT DETECTED Final   Streptococcus agalactiae NOT DETECTED NOT DETECTED Final   Streptococcus pneumoniae NOT DETECTED NOT DETECTED Final   Streptococcus pyogenes NOT DETECTED NOT DETECTED Final   A.calcoaceticus-baumannii NOT DETECTED NOT DETECTED Final   Bacteroides fragilis NOT DETECTED NOT DETECTED Final   Enterobacterales NOT DETECTED NOT DETECTED Final   Enterobacter cloacae complex NOT DETECTED NOT DETECTED Final   Escherichia coli NOT DETECTED NOT DETECTED Final   Klebsiella aerogenes NOT DETECTED NOT DETECTED Final   Klebsiella oxytoca NOT DETECTED NOT DETECTED Final   Klebsiella pneumoniae NOT DETECTED NOT DETECTED Final   Proteus species NOT DETECTED NOT DETECTED Final   Salmonella species NOT DETECTED NOT DETECTED Final   Serratia marcescens NOT DETECTED NOT DETECTED Final   Haemophilus influenzae NOT DETECTED NOT DETECTED Final   Neisseria meningitidis NOT DETECTED NOT DETECTED Final   Pseudomonas aeruginosa NOT DETECTED NOT DETECTED Final   Stenotrophomonas maltophilia NOT DETECTED NOT DETECTED Final   Candida albicans NOT DETECTED NOT DETECTED Final   Candida auris NOT DETECTED NOT DETECTED Final   Candida glabrata NOT DETECTED NOT DETECTED Final   Candida krusei NOT DETECTED NOT DETECTED Final   Candida parapsilosis NOT DETECTED NOT DETECTED Final  Candida tropicalis NOT DETECTED NOT DETECTED Final    Cryptococcus neoformans/gattii NOT DETECTED NOT DETECTED Final    Comment: Performed at [facility], [place], [place] [address]  Acid Fast Smear (AFB)     Status: None   Collection Time: 06/21/22 10:25 AM   Specimen: PATH Cytology Pleural fluid  Result Value Ref Range Status   AFB Specimen Processing Comment  Final    Comment: Tissue Grinding and Digestion/Decontamination   Acid Fast Smear Negative  Final    Comment: (NOTE) Performed At: [facility] [address] [place], [place] [id number] Rush Farmer MD [id number]    Source (AFB) PLEURAL  Final    Comment: Performed at [facility], [address] [place], [place] [address]  Body fluid culture w Gram Stain     Status: None (Preliminary result)   Collection Time: 06/21/22 10:25 AM   Specimen: PATH Cytology Pleural fluid  Result Value Ref Range Status   Specimen Description   Final    PLEURAL Performed at [facility], [address], [place], [place] [address]    Special Requests   Final    NONE Performed at [facility], [place], [place] [address]    Gram Stain NO WBC SEEN NO ORGANISMS SEEN   Final   Culture   Final    NO GROWTH 3 DAYS Performed at [facility], [place] [address] [place], [place] [address]    Report Status PENDING  Incomplete     Labs: BNP (last 3 results) Recent Labs    06/20/22 1136  BNP 970.2*   Basic Metabolic Panel: Recent Labs  Lab 06/20/22 1136 06/21/22 1145 06/22/22 0838 06/23/22 0459 06/24/22 0559  NA 141 140 139 138 137  K 3.6 3.8 3.8 4.1 4.0  CL 103 102 100 101 100  CO2 '30 30 29 28 27  '$ GLUCOSE 135* 172* 111* 126* 131*  BUN 36* 35* 35* 42* 50*  CREATININE 1.99* 2.03* 2.01* 2.12* 2.16*  CALCIUM 9.2 8.9 8.8* 8.8* 8.8*  MG 2.5*  --  2.4 2.5* 2.5*   Liver Function Tests: Recent Labs  Lab 06/20/22 1136  AST 17  ALT 14  ALKPHOS 67  BILITOT 0.7  PROT 7.4  ALBUMIN 4.3   No results  for input(s): "LIPASE", "AMYLASE" in the last 168 hours. No results for input(s): "AMMONIA" in the last 168 hours. CBC: Recent Labs  Lab 06/20/22 1136 06/21/22 1145 06/22/22 0838  WBC 5.6 5.8 6.7  NEUTROABS 4.1 4.2 4.8  HGB 8.3* 7.6* 8.5*  HCT 28.2* 25.1* 27.6*  MCV 94.9 94.4 92.0  PLT 200 186 195   Cardiac Enzymes: No results for input(s): "CKTOTAL", "CKMB", "CKMBINDEX", "TROPONINI" in the last 168 hours. BNP: Invalid input(s): "POCBNP" CBG: No results for input(s): "GLUCAP" in the last 168 hours. D-Dimer No results for input(s): "DDIMER" in the last 72 hours. Hgb A1c No results for input(s): "HGBA1C" in the last 72 hours. Lipid Profile No results for input(s): "CHOL", "HDL", "LDLCALC", "TRIG", "CHOLHDL", "LDLDIRECT" in the last 72 hours. Thyroid function studies No results for input(s): "TSH", "T4TOTAL", "T3FREE", "THYROIDAB" in the last 72 hours.  Invalid input(s): "FREET3" Anemia work up No results for input(s): "VITAMINB12", "FOLATE", "FERRITIN", "TIBC", "IRON", "RETICCTPCT" in the last 72 hours. Urinalysis    Component Value Date/Time   COLORURINE STRAW (A) 05/22/2022 1219   APPEARANCEUR CLEAR (A) 05/22/2022 1219   LABSPEC 1.010 05/22/2022 1219   PHURINE 6.0  05/22/2022 [place] 05/22/2022 1219   HGBUR SMALL (A) 05/22/2022 1219   BILIRUBINUR NEGATIVE 05/22/2022 1219   KETONESUR NEGATIVE 05/22/2022 1219   PROTEINUR 30 (A) 05/22/2022 1219   NITRITE NEGATIVE 05/22/2022 1219   LEUKOCYTESUR TRACE (A) 05/22/2022 1219   Sepsis Labs Recent Labs  Lab 06/20/22 1136 06/21/22 1145 06/22/22 0838  WBC 5.6 5.8 6.7   Microbiology Recent Results (from the past 240 hour(s))  Resp Panel by RT-PCR (Flu A&B, Covid) Anterior Nasal Swab     Status: None   Collection Time: 06/20/22  4:26 PM   Specimen: Anterior Nasal Swab  Result Value Ref Range Status   SARS Coronavirus 2 by RT PCR NEGATIVE NEGATIVE Final    Comment: (NOTE) SARS-CoV-2 target nucleic acids  are NOT DETECTED.  The SARS-CoV-2 RNA is generally detectable in upper respiratory specimens during the acute phase of infection. The lowest concentration of SARS-CoV-2 viral copies this assay can detect is 138 copies/mL. A negative result does not preclude SARS-Cov-2 infection and should not be used as the sole basis for treatment or other patient management decisions. A negative result may occur with  improper specimen collection/handling, submission of specimen other than nasopharyngeal swab, presence of viral mutation(s) within the areas targeted by this assay, and inadequate number of viral copies(<138 copies/mL). A negative result must be combined with clinical observations, patient history, and epidemiological information. The expected result is Negative.  Fact Sheet for Patients:  EntrepreneurPulse.com.au  Fact Sheet for Healthcare Providers:  IncredibleEmployment.be  This test is no t yet approved or cleared by the [place] FDA and  has been authorized for detection and/or diagnosis of SARS-CoV-2 by FDA under an Emergency Use Authorization (EUA). This EUA will remain  in effect (meaning this test can be used) for the duration of the COVID-19 declaration under Section 564(b)(1) of the Act, 21 U.S.C.section 360bbb-3(b)(1), unless the authorization is terminated  or revoked sooner.       Influenza A by PCR NEGATIVE NEGATIVE Final   Influenza B by PCR NEGATIVE NEGATIVE Final    Comment: (NOTE) The Xpert Xpress SARS-CoV-2/FLU/RSV plus assay is intended as an aid in the diagnosis of influenza from Nasopharyngeal swab specimens and should not be used as a sole basis for treatment. Nasal washings and aspirates are unacceptable for Xpert Xpress SARS-CoV-2/FLU/RSV testing.  Fact Sheet for Patients: EntrepreneurPulse.com.au  Fact Sheet for Healthcare Providers: IncredibleEmployment.be  This test is  not yet approved or cleared by the [place] FDA and has been authorized for detection and/or diagnosis of SARS-CoV-2 by FDA under an Emergency Use Authorization (EUA). This EUA will remain in effect (meaning this test can be used) for the duration of the COVID-19 declaration under Section 564(b)(1) of the Act, 21 U.S.C. section 360bbb-3(b)(1), unless the authorization is terminated or revoked.  Performed at [facility], [address] [place], [place] [address]   Blood culture (routine x 2)     Status: None (Preliminary result)   Collection Time: 06/20/22  8:17 PM   Specimen: BLOOD  Result Value Ref Range Status   Specimen Description BLOOD BLOOD LEFT FOREARM  Final   Special Requests   Final    BOTTLES DRAWN AEROBIC AND ANAEROBIC Blood Culture results may not be optimal due to an excessive volume of blood received in culture bottles   Culture   Final    NO GROWTH 4 DAYS Performed at [facility], [address] [place], [place] [address]  Report Status PENDING  Incomplete  Blood culture (routine x 2)     Status: Abnormal   Collection Time: 06/20/22  8:17 PM   Specimen: BLOOD  Result Value Ref Range Status   Specimen Description   Final    BLOOD BLOOD LEFT ARM Performed at [facility], [address], [place], [place] [address]    Special Requests   Final    BOTTLES DRAWN AEROBIC AND ANAEROBIC Blood Culture results may not be optimal due to an excessive volume of blood received in culture bottles Performed at [facility], [address] [place], [place] [address]    Culture  Setup Time   Final    GRAM POSITIVE COCCI AEROBIC BOTTLE ONLY CRITICAL RESULT CALLED TO, READ BACK BY AND VERIFIED WITH: [facility] MITCHELL PHARMD AT 1005 06/22/22. HNM    Culture (A)  Final    STAPHYLOCOCCUS HOMINIS THE SIGNIFICANCE OF ISOLATING THIS ORGANISM FROM A SINGLE SET OF BLOOD CULTURES WHEN MULTIPLE SETS ARE DRAWN IS UNCERTAIN. PLEASE NOTIFY THE  MICROBIOLOGY DEPARTMENT WITHIN ONE WEEK IF SPECIATION AND SENSITIVITIES ARE REQUIRED. Performed at [facility], [place] [address] [place], [place] [address]    Report Status 06/24/2022 FINAL  Final  Blood Culture ID Panel (Reflexed)     Status: Abnormal   Collection Time: 06/20/22  8:17 PM  Result Value Ref Range Status   Enterococcus faecalis NOT DETECTED NOT DETECTED Final   Enterococcus Faecium NOT DETECTED NOT DETECTED Final   Listeria monocytogenes NOT DETECTED NOT DETECTED Final   Staphylococcus species DETECTED (A) NOT DETECTED Final    Comment: CRITICAL RESULT CALLED TO, READ BACK BY AND VERIFIED WITH: DEVAN MITCHELL PHARMD 1005 06/22/22 HNM    Staphylococcus aureus (BCID) NOT DETECTED NOT DETECTED Final   Staphylococcus epidermidis NOT DETECTED NOT DETECTED Final   Staphylococcus lugdunensis NOT DETECTED NOT DETECTED Final   Streptococcus species NOT DETECTED NOT DETECTED Final   Streptococcus agalactiae NOT DETECTED NOT DETECTED Final   Streptococcus pneumoniae NOT DETECTED NOT DETECTED Final   Streptococcus pyogenes NOT DETECTED NOT DETECTED Final   A.calcoaceticus-baumannii NOT DETECTED NOT DETECTED Final   Bacteroides fragilis NOT DETECTED NOT DETECTED Final   Enterobacterales NOT DETECTED NOT DETECTED Final   Enterobacter cloacae complex NOT DETECTED NOT DETECTED Final   Escherichia coli NOT DETECTED NOT DETECTED Final   Klebsiella aerogenes NOT DETECTED NOT DETECTED Final   Klebsiella oxytoca NOT DETECTED NOT DETECTED Final   Klebsiella pneumoniae NOT DETECTED NOT DETECTED Final   Proteus species NOT DETECTED NOT DETECTED Final   Salmonella species NOT DETECTED NOT DETECTED Final   Serratia marcescens NOT DETECTED NOT DETECTED Final   Haemophilus influenzae NOT DETECTED NOT DETECTED Final   Neisseria meningitidis NOT DETECTED NOT DETECTED Final   Pseudomonas aeruginosa NOT DETECTED NOT DETECTED Final   Stenotrophomonas maltophilia NOT DETECTED NOT DETECTED Final    Candida albicans NOT DETECTED NOT DETECTED Final   Candida auris NOT DETECTED NOT DETECTED Final   Candida glabrata NOT DETECTED NOT DETECTED Final   Candida krusei NOT DETECTED NOT DETECTED Final   Candida parapsilosis NOT DETECTED NOT DETECTED Final   Candida tropicalis NOT DETECTED NOT DETECTED Final   Cryptococcus neoformans/gattii NOT DETECTED NOT DETECTED Final    Comment: Performed at [facility], [address] [place], [place] [address]  Acid Fast Smear (AFB)     Status: None   Collection Time: 06/21/22 10:25 AM   Specimen: PATH Cytology Pleural fluid  Result  Value Ref Range Status   AFB Specimen Processing Comment  Final    Comment: Tissue Grinding and Digestion/Decontamination   Acid Fast Smear Negative  Final    Comment: (NOTE) Performed At: [facility] [place], [place] [id number] Rush Farmer MD [id number]    Source (AFB) PLEURAL  Final    Comment: Performed at [facility], [address] [place], [place] [address]  Body fluid culture w Gram Stain     Status: None (Preliminary result)   Collection Time: 06/21/22 10:25 AM   Specimen: PATH Cytology Pleural fluid  Result Value Ref Range Status   Specimen Description   Final    PLEURAL Performed at [facility], [address], [place], [place] [address]    Special Requests   Final    NONE Performed at [facility], [address] [place], [place] [address]    Gram Stain NO WBC SEEN NO ORGANISMS SEEN   Final   Culture   Final    NO GROWTH 3 DAYS Performed at [facility], [place] [address], [place],  [address]    Report Status PENDING  Incomplete     Time coordinating discharge: Over 30 minutes  SIGNED:   Sidney Ace, MD  [facility] 06/24/2022, 11:42 AM Pager   If 7PM-7AM, please contact night-coverage

## 2022-06-24 NOTE — TOC Progression Note (Signed)
Transition of Care [facility]) - Progression Note    Patient Details  Name: Walter Moody MRN: [id number] Date of Birth: 1941/06/30  Transition of [facility]) CM/SW [place] Phone Number: 06/24/2022, 10:07 AM  Clinical Narrative:   CSW spoke with patients wife who stated her husband is still declining home health services.       Barriers to Discharge: Continued Medical Work up  Expected Discharge Plan and Services         Living arrangements for the past 2 months: Single Family Home Expected Discharge Date: 06/24/22                         HH Arranged: Patient Refused [place]           Social Determinants of Health (SDOH) Interventions    Readmission Risk Interventions     No data to display

## 2022-06-25 LAB — BODY FLUID CULTURE W GRAM STAIN
Culture: NO GROWTH
Gram Stain: NONE SEEN

## 2022-06-25 LAB — CULTURE, BLOOD (ROUTINE X 2): Culture: NO GROWTH

## 2022-07-05 ENCOUNTER — Ambulatory Visit: Payer: Medicare Other | Admitting: Family

## 2022-08-04 LAB — ACID FAST CULTURE WITH REFLEXED SENSITIVITIES (MYCOBACTERIA): Acid Fast Culture: NEGATIVE

## 2022-09-18 ENCOUNTER — Emergency Department
Admission: EM | Admit: 2022-09-18 | Discharge: 2022-09-18 | Disposition: A | Discharge status: Home / Self Care | Payer: Medicare Other | Attending: Emergency Medicine | Admitting: Emergency Medicine

## 2022-09-18 ENCOUNTER — Other Ambulatory Visit: Payer: Self-pay

## 2022-09-18 ENCOUNTER — Emergency Department: Payer: Medicare Other

## 2022-09-18 ENCOUNTER — Encounter: Payer: Self-pay | Admitting: Emergency Medicine

## 2022-09-18 DIAGNOSIS — J449 Chronic obstructive pulmonary disease, unspecified: Secondary | ICD-10-CM | POA: Insufficient documentation

## 2022-09-18 DIAGNOSIS — R0781 Pleurodynia: Secondary | ICD-10-CM | POA: Diagnosis present

## 2022-09-18 DIAGNOSIS — N1832 Chronic kidney disease, stage 3b: Secondary | ICD-10-CM | POA: Insufficient documentation

## 2022-09-18 DIAGNOSIS — I2510 Atherosclerotic heart disease of native coronary artery without angina pectoris: Secondary | ICD-10-CM | POA: Insufficient documentation

## 2022-09-18 DIAGNOSIS — F0390 Unspecified dementia without behavioral disturbance: Secondary | ICD-10-CM | POA: Insufficient documentation

## 2022-09-18 DIAGNOSIS — I5033 Acute on chronic diastolic (congestive) heart failure: Secondary | ICD-10-CM | POA: Insufficient documentation

## 2022-09-18 DIAGNOSIS — Z85828 Personal history of other malignant neoplasm of skin: Secondary | ICD-10-CM | POA: Diagnosis not present

## 2022-09-18 DIAGNOSIS — Z8673 Personal history of transient ischemic attack (TIA), and cerebral infarction without residual deficits: Secondary | ICD-10-CM | POA: Insufficient documentation

## 2022-09-18 DIAGNOSIS — Z951 Presence of aortocoronary bypass graft: Secondary | ICD-10-CM | POA: Diagnosis not present

## 2022-09-18 DIAGNOSIS — I130 Hypertensive heart and chronic kidney disease with heart failure and stage 1 through stage 4 chronic kidney disease, or unspecified chronic kidney disease: Secondary | ICD-10-CM | POA: Diagnosis not present

## 2022-09-18 LAB — CBC
HCT: 27.7 % — ABNORMAL LOW (ref 39.0–52.0)
Hemoglobin: 8.1 g/dL — ABNORMAL LOW (ref 13.0–17.0)
MCH: 27.5 pg (ref 26.0–34.0)
MCHC: 29.2 g/dL — ABNORMAL LOW (ref 30.0–36.0)
MCV: 93.9 fL (ref 80.0–100.0)
Platelets: 215 10*3/uL (ref 150–400)
RBC: 2.95 MIL/uL — ABNORMAL LOW (ref 4.22–5.81)
RDW: 17.7 % — ABNORMAL HIGH (ref 11.5–15.5)
WBC: 7.7 10*3/uL (ref 4.0–10.5)
nRBC: 0 % (ref 0.0–0.2)

## 2022-09-18 LAB — COMPREHENSIVE METABOLIC PANEL
ALT: 13 U/L (ref 0–44)
AST: 16 U/L (ref 15–41)
Albumin: 3.6 g/dL (ref 3.5–5.0)
Alkaline Phosphatase: 59 U/L (ref 38–126)
Anion gap: 9 (ref 5–15)
BUN: 28 mg/dL — ABNORMAL HIGH (ref 8–23)
CO2: 27 mmol/L (ref 22–32)
Calcium: 8.7 mg/dL — ABNORMAL LOW (ref 8.9–10.3)
Chloride: 103 mmol/L (ref 98–111)
Creatinine, Ser: 2.1 mg/dL — ABNORMAL HIGH (ref 0.61–1.24)
GFR, Estimated: 31 mL/min — ABNORMAL LOW (ref >60)
Glucose, Bld: 152 mg/dL — ABNORMAL HIGH (ref 70–99)
Potassium: 3.8 mmol/L (ref 3.5–5.1)
Sodium: 139 mmol/L (ref 135–145)
Total Bilirubin: 0.9 mg/dL (ref 0.3–1.2)
Total Protein: 7.5 g/dL (ref 6.5–8.1)

## 2022-09-18 NOTE — ED Triage Notes (Signed)
Pt states a couple of weeks ago he was walking to the bathroom with walker and tripped over the walker. PT co right sided abd pain that has been persistent. Pt denies any new injury or falls.

## 2022-09-18 NOTE — ED Provider Notes (Signed)
[facility] Provider Note    Event Date/Time   First MD Initiated Contact with Patient 09/18/22 1210     (approximate)   History   Fall   HPI  Walter Moody is a 81 y.o. male medical history of COPD CHF on 2 L persistent left-sided pleural effusion presents with rib pain.  Patient had a fall about a month ago.  Was using his walker when he tripped and fell onto his right chest wall.  Did not hit his head.  He has had persistent right lower rib pain since that time.  Denies any shortness of breath or new cough.  Denies lower extremity swelling.  He is compliant with his medications.  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   CAD with hx of CABG 06/20/2022   Acute on chronic diastolic CHF (congestive heart failure) ([place]) 06/20/2022   Chronic respiratory failure with hypoxia ([place]) 06/20/2022   Acute on chronic dyspnea 06/20/2022   History of stroke 06/20/2022   Elevated troponin 06/20/2022   Dementia without behavioral disturbance ([place]) 06/20/2022   Chronic back pain 06/20/2022   Age-related cataract of both eyes 11/23/2020   Chronic midline low back pain without sciatica 03/31/2019   Risk for falls 12/10/2018   Left inguinal hernia 06/11/2017   Claudication of both lower extremities ([place]) 03/21/2017   Benign prostatic hyperplasia with lower urinary tract symptoms 05/24/2016   Iliac artery aneurysm, 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   Constipation 10/19/2015   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   Atherosclerotic vascular disease 07/22/2014   History of AAA (abdominal aortic aneurysm) repair 06/04/2014   Anemia 11/26/2013   Hypertension 11/26/2013   History of nonmelanoma skin cancer 10/27/2013   Actinic keratoses 03/05/2013   Multiple pulmonary nodules 03/05/2013   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      Excl. in [place]?     Most recent vital signs: Vitals:   09/18/22 0735 09/18/22 1134  BP: 128/79 (!) 158/72  Pulse: 83 76  Resp: 18 18  Temp: 98.5 F (36.9 C) 98.6 F (37 C)  SpO2: 95% 97%     General: Awake, no distress.  Chronically ill-appearing CV:  Good peripheral perfusion.  No peripheral edema Resp:  No wheezing, decreased breath sounds at the left base Abd:  No distention.  Abdomen is soft nontender no bruising Neuro:             Awake, Alert, Oriented x 3  Other:     ED Results / Procedures / Treatments  Labs (all labs ordered are listed, but only abnormal results are displayed) Labs Reviewed  CBC - Abnormal; Notable for the following components:      Result Value   RBC 2.95 (*)    Hemoglobin 8.1 (*)    HCT 27.7 (*)    MCHC 29.2 (*)    RDW 17.7 (*)    All other components within normal limits  COMPREHENSIVE METABOLIC PANEL - Abnormal; Notable for the following components:   Glucose, Bld 152 (*)    BUN 28 (*)    Creatinine, Ser 2.10 (*)    Calcium 8.7 (*)    GFR, Estimated 31 (*)    All other components within normal limits  URINALYSIS, ROUTINE W REFLEX MICROSCOPIC     EKG     RADIOLOGY Reviewed and interpreted the x-ray of the  chest which shows a pleural effusion on the left   PROCEDURES:  Critical Care performed: No  Procedures   MEDICATIONS ORDERED IN ED: Medications - No data to display   IMPRESSION / MDM / [place] / ED COURSE  I reviewed the triage vital signs and the nursing notes.                              Patient's presentation is most consistent with acute complicated illness / injury requiring diagnostic workup.  Differential diagnosis includes, but is not limited to, rib fracture pneumothorax pleural effusion pneumonia pulmonary embolism  The patient is an 81 year old male who presents with right-sided rib pain.  This has persisted since a fall onto the right rib about a month ago.  He denies any dyspnea.  He is on his 2 L which he is chronically on.  Chest x-ray obtained shows left-sided pleural effusion and some scarring at the right base.  Reviewed admission from September which notes that patient has frequent left-sided pleural effusion and it was transudative thought to be due to his heart failure.  Was recommended that he does not currently undergo repeat thoracentesis.  Labs obtained show stable hemoglobin and stable renal function.  Discussed with patient that we could obtain a CTA or CT of the chest to further evaluate for causes of underlying pain and assess for occult rib fracture however he did not wish to stay for any repeat imaging or medications.  Given he is not hypoxic with injury that occurred about a month ago this is not unreasonable.  Do not think he needs intervention for this effusion currently so I did allow him to be discharged.  Discussed return precautions.       FINAL CLINICAL IMPRESSION(S) / ED DIAGNOSES   Final diagnoses:  Rib pain     Rx / DC Orders   ED Discharge Orders     None        Note:  This document was prepared using Dragon voice recognition software and may include unintentional dictation errors.   Rada Hay,  MD 09/18/22 1308

## 2023-06-17 DEATH — deceased

## 2023-11-28 IMAGING — CT CT HEAD W/O CM
4 series · 15 of 47 positions shown, 17 images · non-contrast
Comparison: None Available.

CLINICAL DATA: Fall with minor head trauma. Epistaxis today,
currently controlled.



[Series 2: head wo · axial · 0.45mm/px · z∈[-121,-6]mm · 7 of 31 slices shown, 9 images]
[im 4/31  brain]
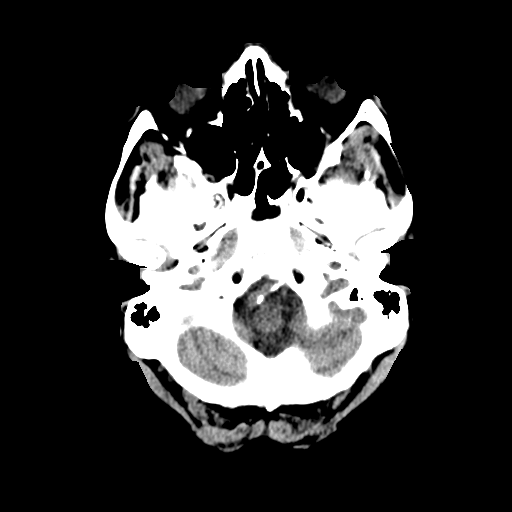
[im 4/31  bone]
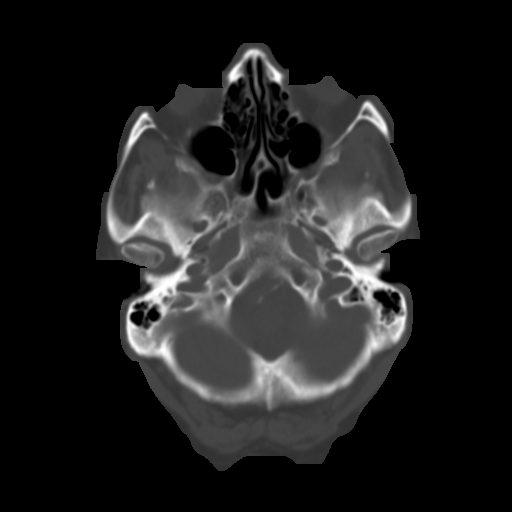
[im 8/31  brain]
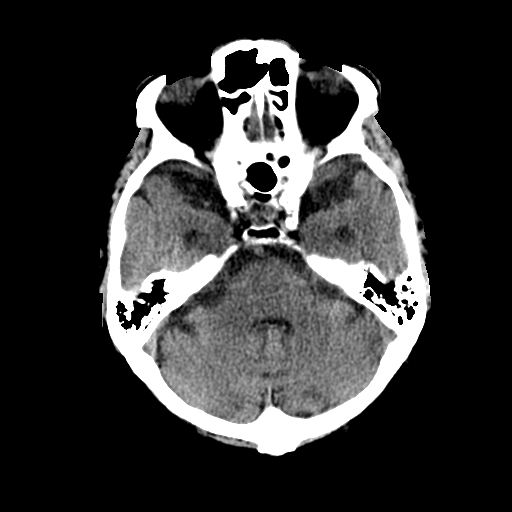
[im 12/31  brain]
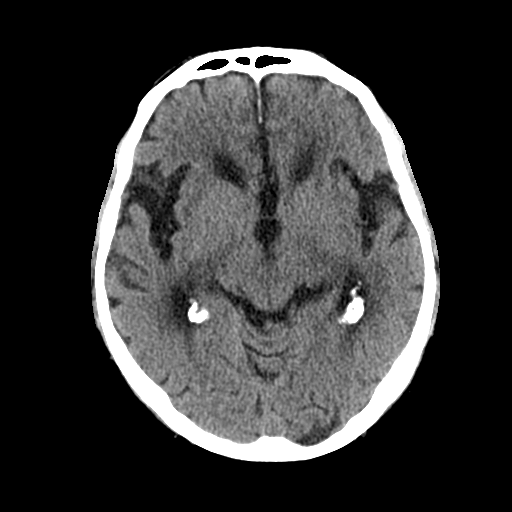
[im 16/31  brain]
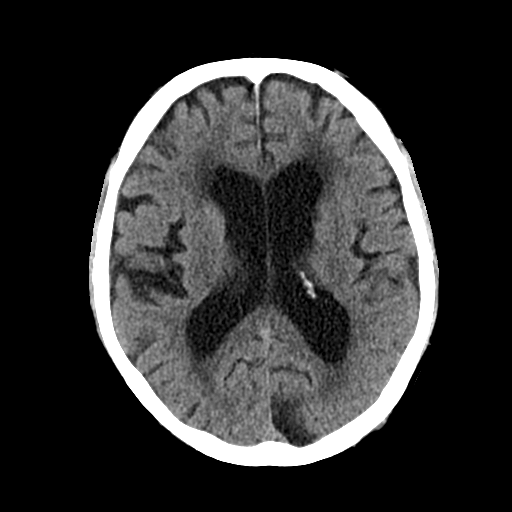
[im 19/31  brain]
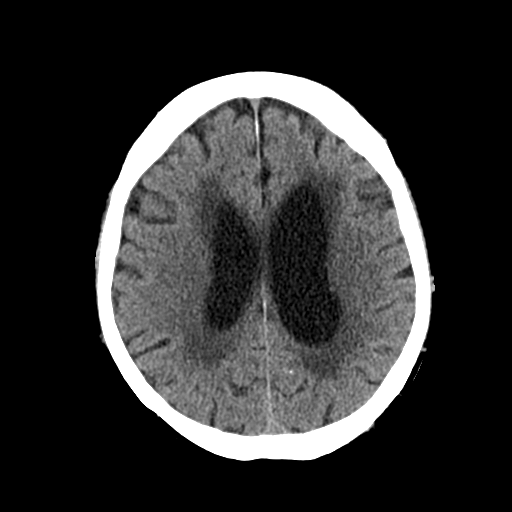
[im 19/31  bone]
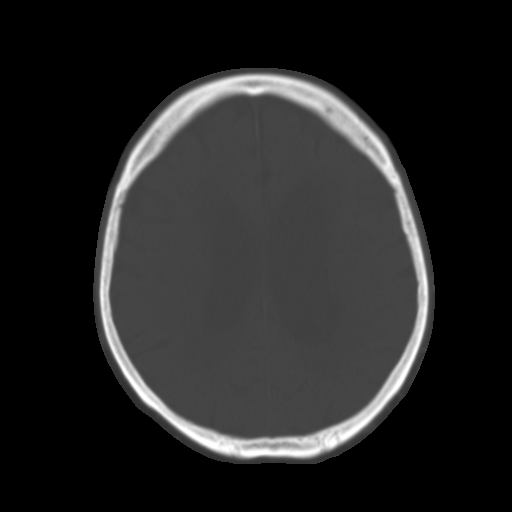
[im 23/31  brain]
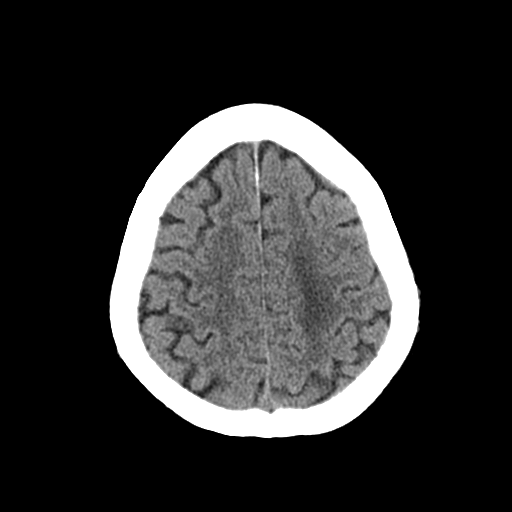
[im 27/31  brain]
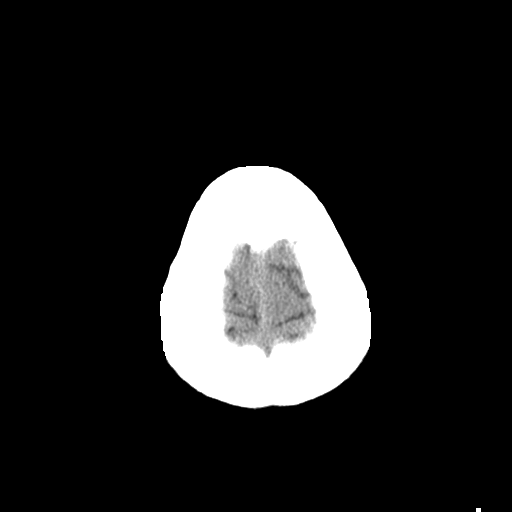

[Series 3: head bone · axial · 0.45mm/px · z∈[-122,-106]mm · 2 of 76 slices shown]
[im 8/76  bone]
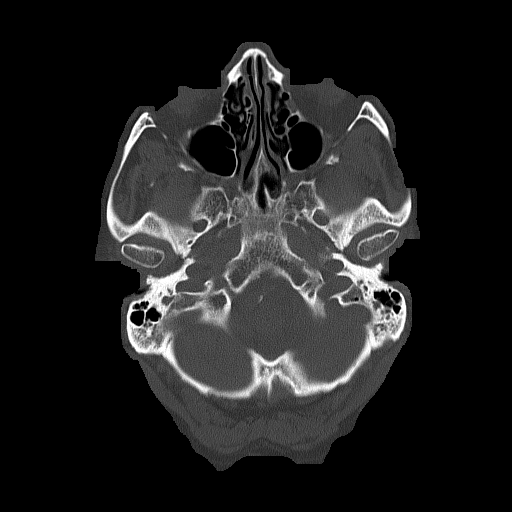
[im 16/76  bone]
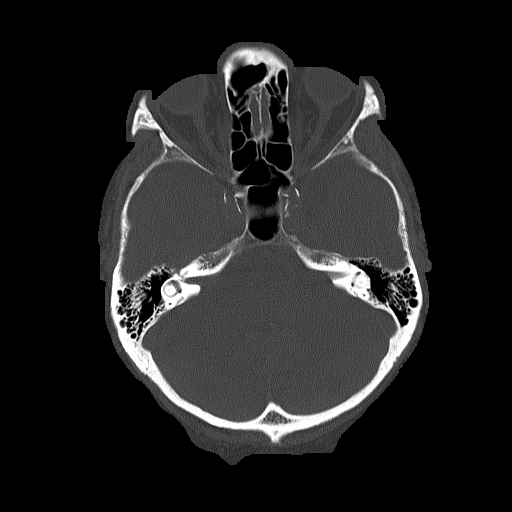

[Series 4: coronal soft tissue · coronal · 0.32mm/px · 3 of 67 slices shown]
[im 23/67  brain]
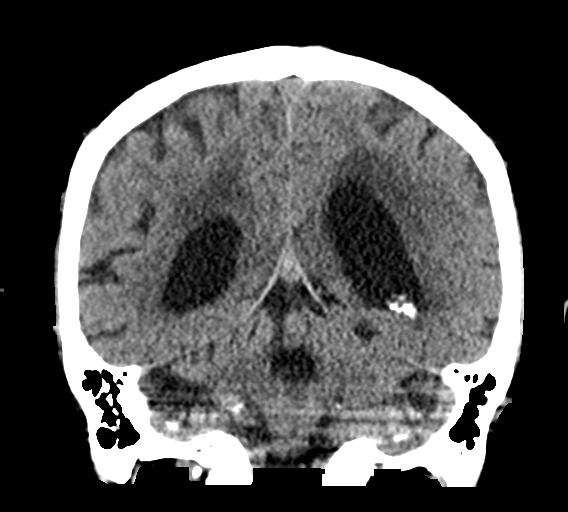
[im 30/67  brain]
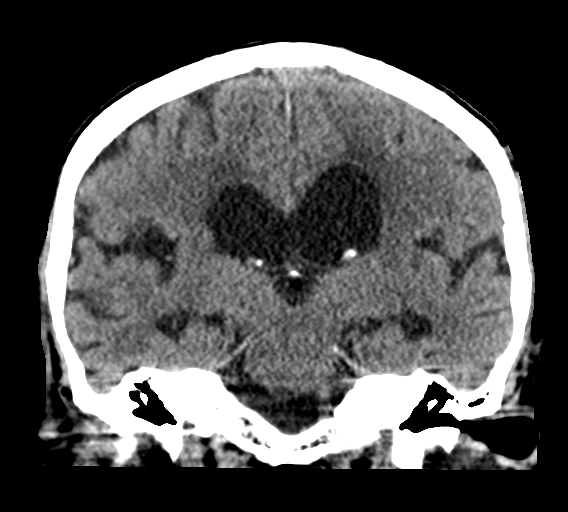
[im 37/67  brain]
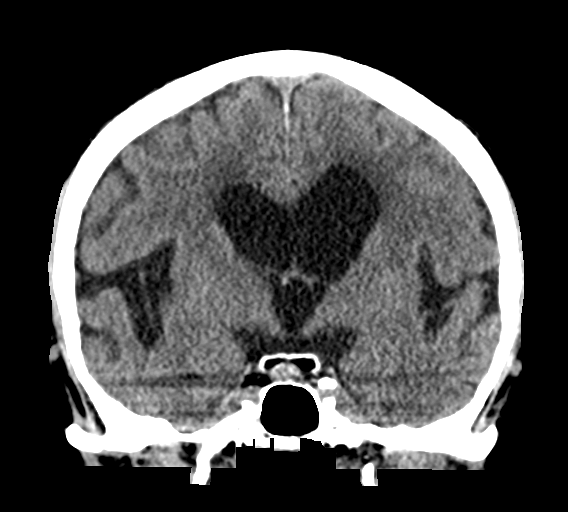

[Series 5: sagittal soft tissue · sagittal · 0.32mm/px · 3 of 59 slices shown]
[im 20/59  brain]
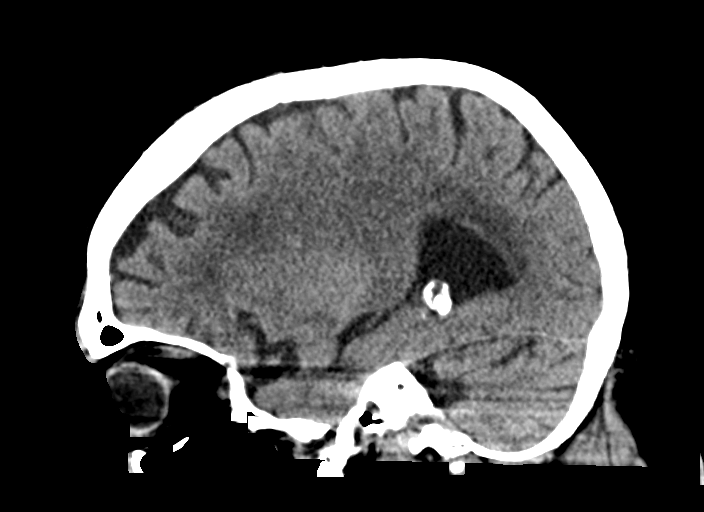
[im 30/59  brain]
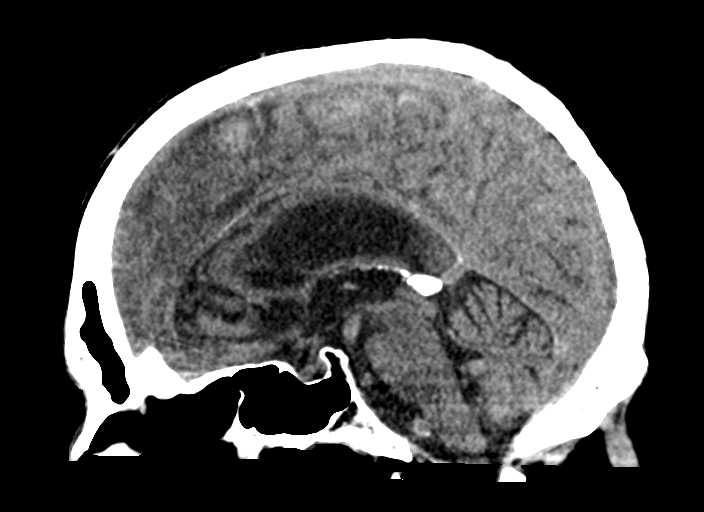
[im 39/59  brain]
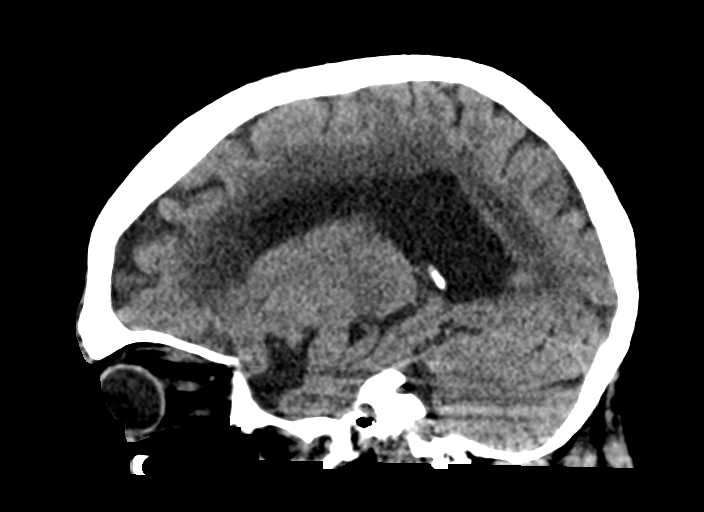

[15 of 47 positions shown; findings below may reference images not displayed]

FINDINGS: Brain: There is encephalomalacia consistent with a chronic infarct
in the posteromedial upper left occipital lobe. There is moderately
advanced cerebral atrophy, with atrophic ventriculomegaly and
moderate to severe small vessel disease of the cerebral white
matter.

There is relatively mild cerebellar atrophy. No asymmetry is seen
concerning for an acute infarct, hemorrhage or mass. Basal cisterns
are clear.

Vascular: The carotid siphons are heavily calcified. There are no
hyperdense central vessels.

Skull: The calvarium, skull base and orbits are intact. There is no
visible scalp hematoma. No skull lesion is seen.

Sinuses/Orbits: There is mild membrane thickening in the ethmoid air
cells, left maxillary sinus. There are no sinus fluid levels. Other
visualized sinuses, bilateral mastoid air cells are clear.

There have been prior lens extractions. There are bitemporal
posterior uveoscleral staphylomas.

Other: None.
IMPRESSION: 1. No acute intracranial CT findings or depressed skull fractures.
2. Atrophy and small-vessel disease.
3. Carotid atherosclerosis.
4. Chronic left occipital infarct.

## 2023-11-28 IMAGING — CT CT RENAL STONE PROTOCOL
2 of 4 series · 12 of 46 positions shown, 14 images · non-contrast
Comparison: None Available.

CLINICAL DATA: Flank pain, suspected kidney stone. Fell yesterday
with bruising in the left flank.



[Series 2: stone full standard · axial · 0.89mm/px · z∈[-1018,-608]mm · 9 of 100 slices shown, 11 images]
[im 9/100  soft-tissue]
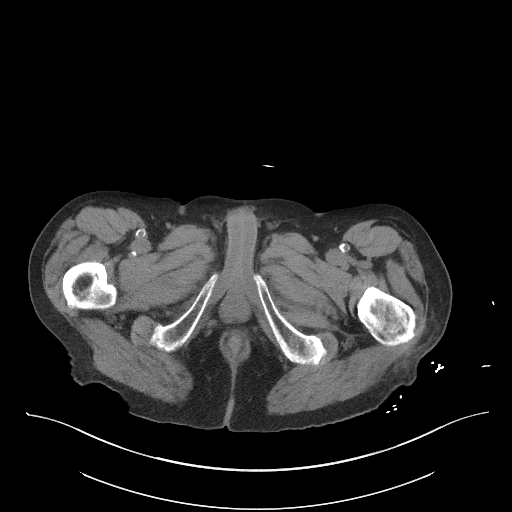
[im 9/100  bone]
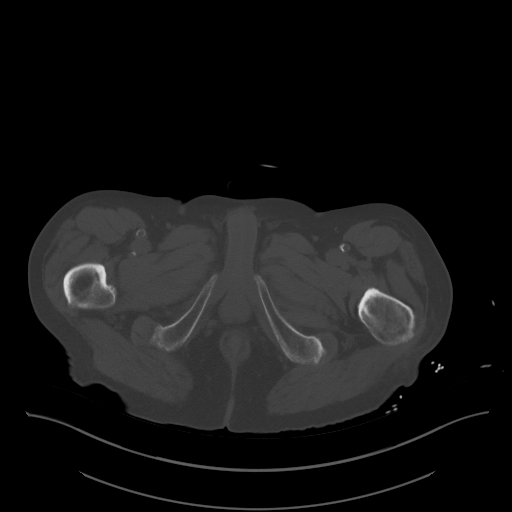
[im 17/100  soft-tissue]
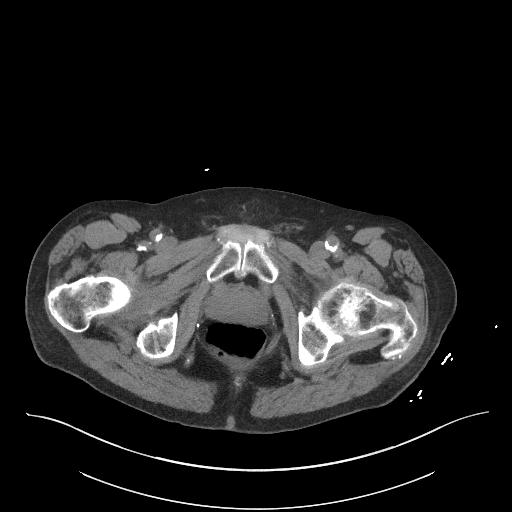
[im 29/100  soft-tissue]
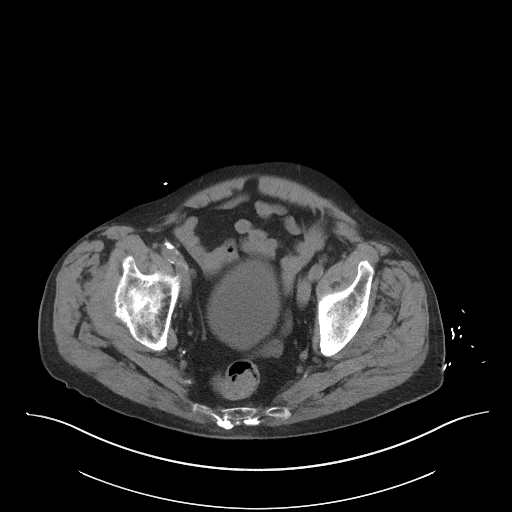
[im 38/100  soft-tissue]
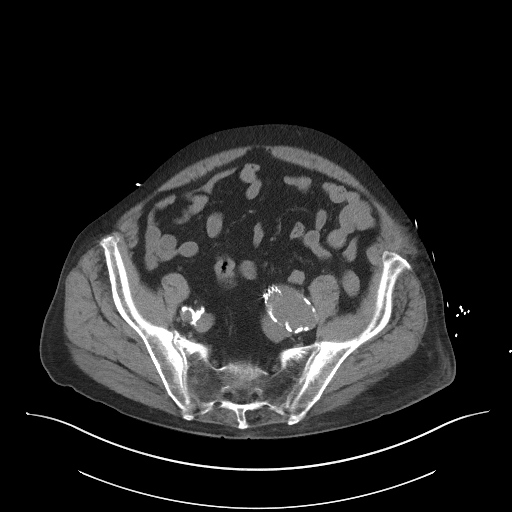
[im 50/100  soft-tissue]
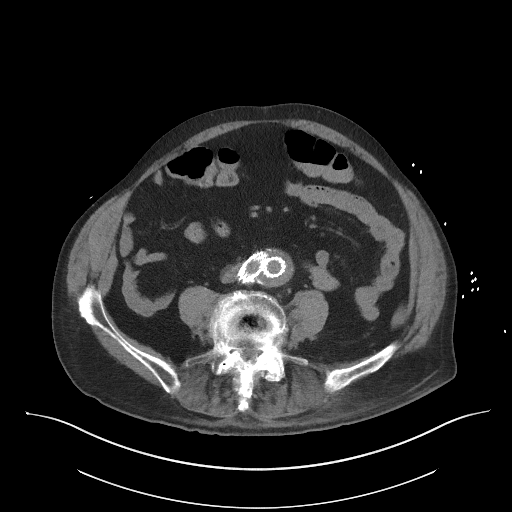
[im 62/100  soft-tissue]
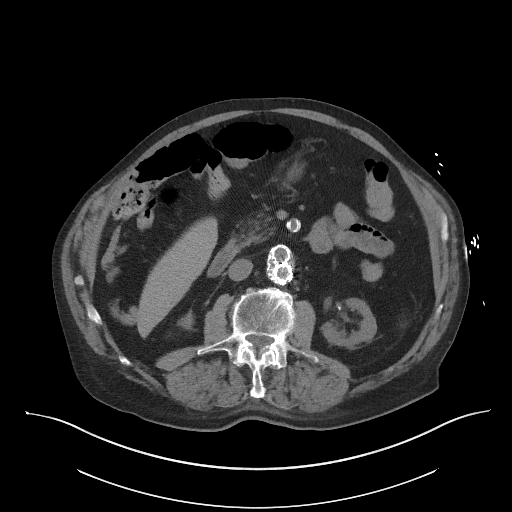
[im 71/100  soft-tissue]
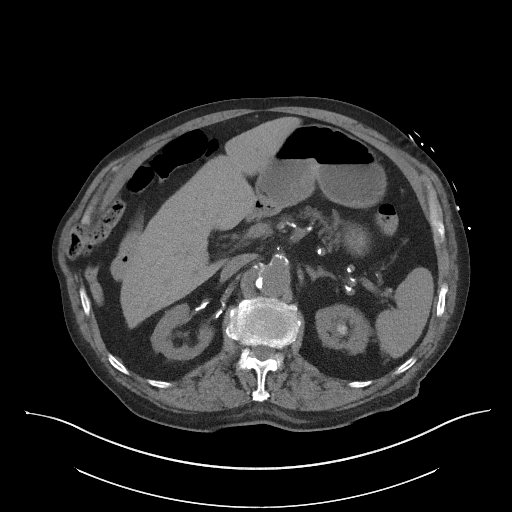
[im 83/100  soft-tissue]
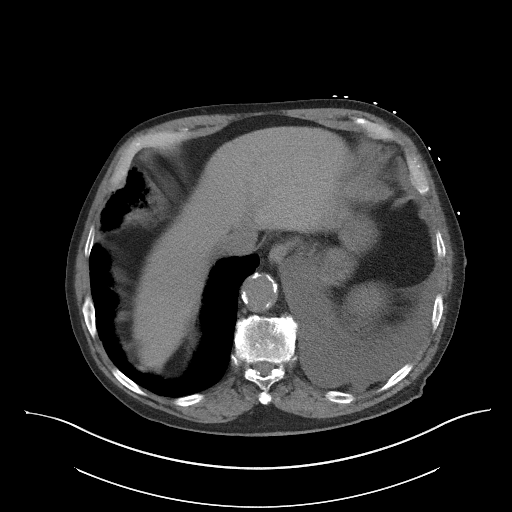
[im 91/100  soft-tissue]
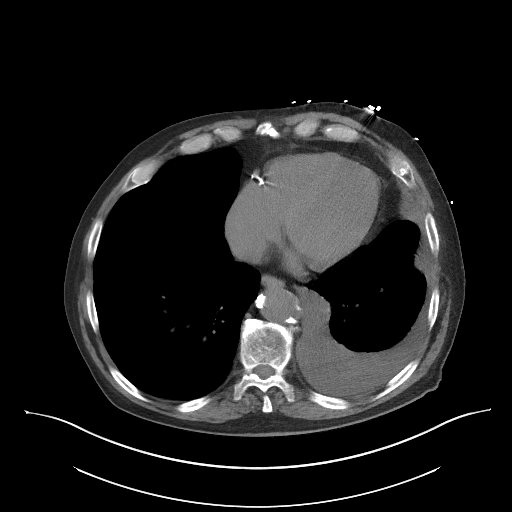
[im 91/100  bone]
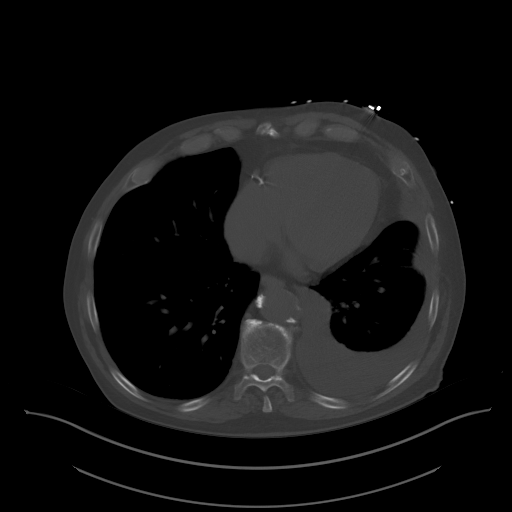

[Series 5: coronal · coronal · 0.77mm/px · 3 of 150 slices shown]
[im 50/150  soft-tissue]
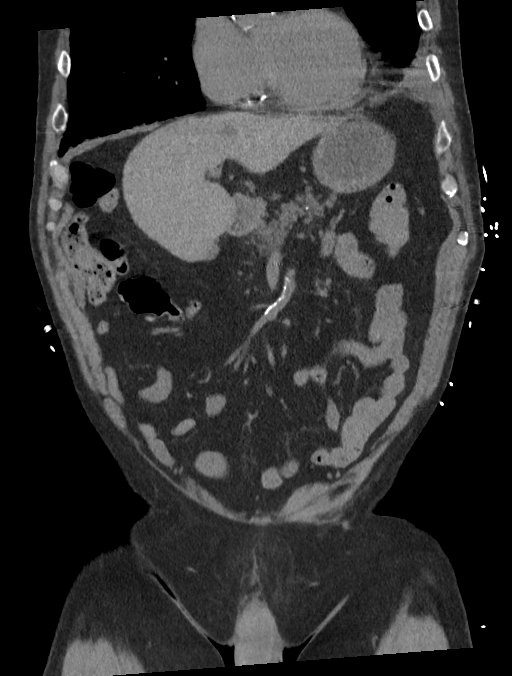
[im 67/150  soft-tissue]
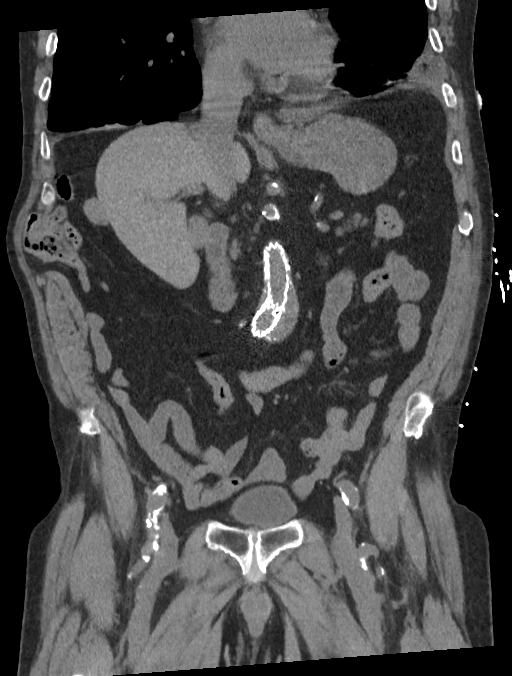
[im 83/150  soft-tissue]
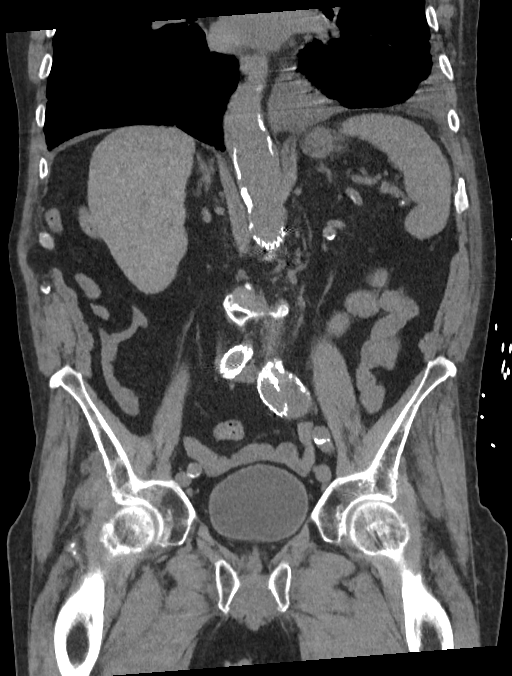

[12 of 46 positions shown; findings below may reference images not displayed]

FINDINGS: Lower chest: Small to moderate-sized, mostly layering left pleural
effusion of low-density is noted, fluid measuring 1.3 Hounsfield
units consistent with simple fluid.

Some of the fluid tracks anterolaterally and could be partially
loculated. There is no right pleural effusion.

Lung bases are emphysematous with few scattered scar-like opacities
with coarsening of the interstitium along the dome of the right
hemidiaphragm consistent with chronic change.

There is a 4 mm noncalcified right middle lobe nodule on [DATE]. Lung
bases are otherwise clear.

The cardiac size is normal. There is heavy three-vessel calcific
CAD. There is calcification and thickening of the aortic valve.

The intraventricular cardiac blood pool is low in density consistent
with anemia. There is no pericardial effusion.

Hepatobiliary: No focal abnormality is seen without contrast. The
gallbladder and bile ducts are unremarkable.

Pancreas: Moderately fatty replaced. Otherwise unremarkable without
contrast.

Spleen: There are few calcified granulomas in the spleen. Otherwise
unremarkable spleen. No splenomegaly.

Adrenals/Urinary Tract: There is no adrenal mass. There is a 2.3 cm
cyst of the posterior lower left kidney of 5.8 Hounsfield units a
1.8 cm cyst of the anterior lower left kidney of -1.0 Hounsfield
units with asymmetric cortical volume loss in the inferior pole of
the right kidney.

No focal cortical abnormality is seen of the remaining unenhanced
kidneys. There are renovascular calcifications at both renal hila.

On the right there is a 3 mm nonobstructive caliceal stone in the
lower pole and on the left, a 1.7 x 1.6 cm heterogeneous
nonobstructing stone in the superior pole.

No hydronephrosis or ureteral stones are seen. There is no
appreciable bladder thickening.

Stomach/Bowel: No dilatation or wall thickening including the
appendix. There is a wide mouth small shallow hernia of the right
upper lateral abdominal wall just inferior to the ribcage. A portion
of the distal ascending colon protrudes into the gap but there is no
bowel strangulation.

There are scattered sigmoid diverticula without evidence of
diverticulitis.

Vascular/Lymphatic: There are extensive vascular calcifications of
the aorta and branch arteries. There has been prior stenting of the
proximal celiac and superior mesenteric arteries and there is likely
significant calcific stenosis in both proximal renal arteries.

There is an aorto bi-iliac stent graft beginning just below the
renal artery takeoffs. The excluded infrarenal aneurysmal sac
measures 4.4 x 4.1 cm transverse and AP and extends a length of
cm.

There is an aneurysm of the mid to distal left common iliac artery
measuring up to 6.4 x 3.7 cm.

Moderate to heavy calcifications continue into the internal and
external iliac and common femoral arteries, proximal superficial
femoral arteries.

No abdominopelvic adenopathy is seen. There are numerous pelvic
phleboliths.

Reproductive: Prostatomegaly. The prostate is 5.3 cm transverse with
bladder base impression.

Other: There are small inguinal fat hernias. There is no
incarcerated hernia. Right upper lateral abdominal wall hernia
described above.

Musculoskeletal: Most notably there is a recent appearing chip
fracture of the posterosuperior crest of the left iliac bone and
another recent appearing fracture with slightly distracted
transverse fracture of the left distal L4 transverse process. There
is stranding in the left flank soft tissues most likely due to
ecchymosis and contusion.

On the sagittal reformatting, there is mild upper plate anterior
wedge compression fracture deformity of the L1 and L4 vertebral
bodies of unknown age without retropulsion.

There is degenerative disc disease and vacuum phenomenon at L4-5 and
L5-S1 with advanced facet hypertrophy, and a grade 1 degenerative
anterolisthesis at L4-5.

There is osteopenia and multilevel bridging enthesopathy in the
lower thoracic spine. The bony pelvis, sacrum and imaged proximal
femurs are intact.
IMPRESSION: 1. There is a recent appearing nondisplaced chip fracture of the
posterosuperior left iliac crest.
2. Recent appearing slightly distracted fracture of the distal left
L4 transverse process.
3. Age indeterminate mild upper plate anterior wedge compression
fracture deformities of L1 and 4.
4. Stranding in the subcutaneous plane in the left flank consistent
with likely contusions, ecchymosis.
5. Small to moderate-sized left pleural effusion part of which could
be loculated. There are healed fracture deformities in the adjacent
lower left ribs but no displaced acute lower ribcage fracture is
seen.
6. COPD, chronic changes in the lung bases.
7. 4 mm right middle lobe nodule. No routine follow-up imaging is
recommended per [HOSPITAL] Guidelines. Lung these guidelines
do not apply to immunocompromised patients and patients with cancer.
Follow up in patients with significant comorbidities as clinically
warranted. For lung cancer screening, adhere to Lung-RADS
guidelines. Reference: Radiology. 7203; 284(1):228-43.
8. Nonobstructive nephrolithiasis.  Small left renal cysts.
9. Prostatomegaly.
10. Nonobstructive inguinal and right lateral upper abdominal wall
hernias.
11. Extensive vascular calcifications, prior SMA/celiac arterial
stenting, and likely flow-limiting calcific proximal renal artery
stenoses.
12. Aorto bi-iliac stent graft with 4.4 x 4.1 cm excluded infrarenal
aneurysm sac and a large aneurysm of the left common iliac artery
measuring 6.4 x 3.7 cm.
